# Patient Record
Sex: Male | Born: 1995 | Race: Black or African American | Hispanic: No | Marital: Single | Smoking: Former smoker
Health system: Southern US, Community
[De-identification: ages and names within clinical notes are randomized; demographics above are authoritative.]

## PROBLEM LIST (undated history)

## (undated) ENCOUNTER — Ambulatory Visit

---
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
## 2017-01-27 ENCOUNTER — Encounter (HOSPITAL_COMMUNITY): Payer: Self-pay | Admitting: Emergency Medicine

## 2017-01-27 ENCOUNTER — Emergency Department (HOSPITAL_COMMUNITY)
Admission: EM | Admit: 2017-01-27 | Discharge: 2017-01-27 | Disposition: A | Discharge status: Home / Self Care | Payer: PRIVATE HEALTH INSURANCE | Attending: Emergency Medicine | Admitting: Emergency Medicine

## 2017-01-27 DIAGNOSIS — H00012 Hordeolum externum right lower eyelid: Secondary | ICD-10-CM

## 2017-01-27 DIAGNOSIS — F17210 Nicotine dependence, cigarettes, uncomplicated: Secondary | ICD-10-CM | POA: Insufficient documentation

## 2017-01-27 DIAGNOSIS — H5711 Ocular pain, right eye: Secondary | ICD-10-CM | POA: Diagnosis present

## 2017-01-27 MED ORDER — ERYTHROMYCIN 5 MG/GM OP OINT: TOPICAL_OINTMENT | OPHTHALMIC | 0 refills | Status: DC

## 2017-01-27 NOTE — Discharge Instructions (Signed)
Apply antibiotic ointment as directed.  Follow-up with your primary care doctor in 24-48 hours for further evaluation.   Use warm compresses on the eye drop a day to help with continued drainage.  Return to the emergency department for any worsening pain, vision changes, fever, stroke swelling or redness of the eyelid or any other worsening or concerning symptoms.

## 2017-01-27 NOTE — ED Notes (Signed)
Pt presents with R eye swelling and pain with "white" discharge today.  Swelling started yesterday.  Does not recall injuring eye.

## 2017-01-27 NOTE — ED Triage Notes (Signed)
R ee swelling and discharge since yesterday. Pain when palpating

## 2017-01-27 NOTE — ED Provider Notes (Signed)
[facility]-EMERGENCY DEPT Provider Note   CSN: [id number] Arrival date & time: 01/27/17  2024  By signing my name below, I, Deland PrettySherilynn Knight, attest that this documentation has been prepared under the direction and in the presence of Graciella FreerLindsey Layden, PA-C. Electronically Signed: Deland PrettySherilynn Knight, ED Scribe. 01/27/17. 11:08 PM.  History   Chief Complaint Chief Complaint  Patient presents with  . Facial Swelling   The history is provided by the patient. No language interpreter was used.   HPI Comments: Andrew Leon is a 21 y.o. male who presents to the Emergency Department complaining of mild right eye pain and right eyelid swelling that began yesterday morning. Patient reports that today he noticed some drainage to the eye, prompting ED visit. He states that pain is worsened when you touch the lower eyelid. He states that he used a warm compress on his eye for the majority of the day with inadequate relief. He does not currently wear glasses and contacts. NKDA. He denies injury to the eye. He also denies eye redness, fever, visual disturbances, photophobia.  History reviewed. No pertinent past medical history.  There are no active problems to display for this patient.   History reviewed. No pertinent surgical history.     Home Medications    Prior to Admission medications   Medication Sig Start Date End Date Taking? Authorizing Provider  erythromycin ophthalmic ointment Place a 1/2 inch ribbon of ointment into the lower eyelid. 01/27/17   Maxwell CaulLayden, Lindsey A, PA-C  HYDROcodone-acetaminophen (NORCO/VICODIN) 5-325 MG per tablet Take 1 tablet by mouth every 4 (four) hours as needed. 10/16/13   Schorr, Roma KayserKatherine P, NP    Family History No family history on file.  Social History Social History  Substance Use Topics  . Smoking status: Current Every Day Smoker    Packs/day: 1.00    Types: Cigarettes  . Smokeless tobacco: Never Used  . Alcohol use No     Allergies   Patient has no  known allergies.   Review of Systems Review of Systems  Constitutional: Negative for fever.  Eyes: Positive for pain and discharge. Negative for photophobia, redness and visual disturbance.     Physical Exam Updated Vital Signs BP 133/78 (BP Location: Right Arm)   Pulse 90   Temp 97.9 F (36.6 C) (Oral)   Resp 16   Ht 5\' 9"  (1.753 m)   Wt 102.1 kg (225 lb)   SpO2 97%   BMI 33.23 kg/m   Physical Exam  Constitutional: He appears well-developed and well-nourished.  HENT:  Head: Normocephalic and atraumatic.  Eyes: Conjunctivae and EOM are normal. Pupils are equal, round, and reactive to light. Right eye exhibits no discharge. Left eye exhibits discharge and hordeolum. Right conjunctiva is not injected. Left conjunctiva is not injected. No scleral icterus.  No tenderness palpation, edema, erythema of periorbital regions bilaterally. EOMs intact without difficulty. Mild swelling and evidence of a hordeolum to the right lower eyelid with some mild surrounding discharge.  Pulmonary/Chest: Effort normal.  Neurological: He is alert.  Skin: Skin is warm and dry.  Psychiatric: He has a normal mood and affect. His speech is normal and behavior is normal.  Nursing note and vitals reviewed.    ED Treatments / Results   DIAGNOSTIC STUDIES: Oxygen Saturation is 98% on RA, normal by my interpretation.   COORDINATION OF CARE: 11:00 PM-Discussed next steps with pt. Pt verbalized understanding and is agreeable with the plan.   Labs (all labs ordered are listed, but  only abnormal results are displayed) Labs Reviewed - No data to display  EKG  EKG Interpretation None       Radiology No results found.  Procedures Procedures (including critical care time)  Medications Ordered in ED Medications - No data to display   Initial Impression / Assessment and Plan / ED Course  I have reviewed the triage vital signs and the nursing notes.  Pertinent labs & imaging results that  were available during my care of the patient were reviewed by me and considered in my medical decision making (see chart for details).     21 yo M Who presents with right lower eyelid pain 1 day. Patient is afebrile, non-toxic appearing, sitting comfortably on examination table. Presentation appears to be consistent with stye of the right lower eyelid. History/physical exam are not concerning for preseptal cellulitis. Will plan to provide symptomatic treatment for the stye. Conservative therapy discussed with patient. Instructed patient to follow-up with  in 2 days. Return precautions discussed. Patient expresses understanding and agreement to plan.     Final Clinical Impressions(s) / ED Diagnoses   Final diagnoses:  Hordeolum of right lower eyelid, unspecified hordeolum type    New Prescriptions Discharge Medication List as of 01/27/2017 11:22 PM    START taking these medications   Details  erythromycin ophthalmic ointment Place a 1/2 inch ribbon of ointment into the lower eyelid., Print       I personally performed the services described in this documentation, which was scribed in my presence. The recorded information has been reviewed and is accurate.     Maxwell Caul, PA-C 01/28/17 0301    Benjiman Core, MD 01/28/17 1447

## 2017-02-02 ENCOUNTER — Ambulatory Visit: Payer: PRIVATE HEALTH INSURANCE | Admitting: Podiatry

## 2017-02-25 ENCOUNTER — Ambulatory Visit: Payer: PRIVATE HEALTH INSURANCE | Admitting: Podiatry

## 2018-01-16 ENCOUNTER — Emergency Department (HOSPITAL_COMMUNITY)
Admission: EM | Admit: 2018-01-16 | Discharge: 2018-01-16 | Disposition: A | Discharge status: Home / Self Care | Payer: PRIVATE HEALTH INSURANCE | Attending: Emergency Medicine | Admitting: Emergency Medicine

## 2018-01-16 ENCOUNTER — Encounter (HOSPITAL_COMMUNITY): Payer: Self-pay | Admitting: *Deleted

## 2018-01-16 ENCOUNTER — Emergency Department (HOSPITAL_COMMUNITY): Payer: PRIVATE HEALTH INSURANCE

## 2018-01-16 ENCOUNTER — Other Ambulatory Visit: Payer: Self-pay

## 2018-01-16 DIAGNOSIS — S62339A Displaced fracture of neck of unspecified metacarpal bone, initial encounter for closed fracture: Secondary | ICD-10-CM | POA: Diagnosis not present

## 2018-01-16 DIAGNOSIS — F17210 Nicotine dependence, cigarettes, uncomplicated: Secondary | ICD-10-CM | POA: Diagnosis not present

## 2018-01-16 DIAGNOSIS — S6991XA Unspecified injury of right wrist, hand and finger(s), initial encounter: Secondary | ICD-10-CM | POA: Diagnosis present

## 2018-01-16 DIAGNOSIS — Y929 Unspecified place or not applicable: Secondary | ICD-10-CM | POA: Insufficient documentation

## 2018-01-16 DIAGNOSIS — Y998 Other external cause status: Secondary | ICD-10-CM | POA: Insufficient documentation

## 2018-01-16 DIAGNOSIS — Y939 Activity, unspecified: Secondary | ICD-10-CM | POA: Diagnosis not present

## 2018-01-16 MED ORDER — IBUPROFEN 800 MG PO TABS
800.0000 mg | ORAL_TABLET | Freq: Once | ORAL | Status: AC
  Administered 2018-01-16: 800 mg via ORAL
  Filled 2018-01-16: qty 1

## 2018-01-16 MED ORDER — IBUPROFEN 800 MG PO TABS: 800.0000 mg | ORAL_TABLET | Freq: Three times a day (TID) | ORAL | 0 refills | Status: DC
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
## 2018-01-16 NOTE — ED Notes (Signed)
Patient came back in 

## 2018-01-16 NOTE — Discharge Instructions (Signed)
Take the prescribed medication as directed.  Leave splint in place. Follow-up with Dr. Izora Ribasoley-- call his office Monday to schedule appt. Return to the ED for new or worsening symptoms.

## 2018-01-16 NOTE — ED Notes (Signed)
Patient left after triage
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
## 2018-01-21 ENCOUNTER — Ambulatory Visit: Payer: PRIVATE HEALTH INSURANCE | Admitting: Podiatry

## 2018-02-03 ENCOUNTER — Encounter (HOSPITAL_COMMUNITY)
Admission: AD | Disposition: A | Discharge status: Home / Self Care | Payer: Self-pay | Source: Ambulatory Visit | Attending: Orthopedic Surgery

## 2018-02-03 ENCOUNTER — Ambulatory Visit (HOSPITAL_COMMUNITY): Payer: PRIVATE HEALTH INSURANCE | Admitting: Certified Registered Nurse Anesthetist

## 2018-02-03 ENCOUNTER — Ambulatory Visit (HOSPITAL_COMMUNITY)
Admission: AD | Admit: 2018-02-03 | Discharge: 2018-02-03 | Disposition: A | Discharge status: Home / Self Care | Payer: PRIVATE HEALTH INSURANCE | Source: Ambulatory Visit | Attending: Orthopedic Surgery | Admitting: Orthopedic Surgery

## 2018-02-03 ENCOUNTER — Encounter (HOSPITAL_COMMUNITY): Payer: Self-pay | Admitting: *Deleted

## 2018-02-03 ENCOUNTER — Ambulatory Visit: Payer: PRIVATE HEALTH INSURANCE | Admitting: Podiatry

## 2018-02-03 DIAGNOSIS — Z538 Procedure and treatment not carried out for other reasons: Secondary | ICD-10-CM | POA: Insufficient documentation

## 2018-02-03 DIAGNOSIS — Z6833 Body mass index (BMI) 33.0-33.9, adult: Secondary | ICD-10-CM | POA: Insufficient documentation

## 2018-02-03 DIAGNOSIS — F17210 Nicotine dependence, cigarettes, uncomplicated: Secondary | ICD-10-CM | POA: Insufficient documentation

## 2018-02-03 DIAGNOSIS — E669 Obesity, unspecified: Secondary | ICD-10-CM | POA: Insufficient documentation

## 2018-02-03 DIAGNOSIS — S62327P Displaced fracture of shaft of fifth metacarpal bone, left hand, subsequent encounter for fracture with malunion: Secondary | ICD-10-CM

## 2018-02-03 DIAGNOSIS — S62336P Displaced fracture of neck of fifth metacarpal bone, right hand, subsequent encounter for fracture with malunion: Secondary | ICD-10-CM | POA: Insufficient documentation

## 2018-02-03 HISTORY — PX: OPEN REDUCTION INTERNAL FIXATION (ORIF) METACARPAL: SHX6234

## 2018-02-03 SURGERY — OPEN REDUCTION INTERNAL FIXATION (ORIF) METACARPAL
Anesthesia: General | Laterality: Right

## 2018-02-03 MED ORDER — MIDAZOLAM HCL 2 MG/2ML IJ SOLN
INTRAMUSCULAR | Status: AC
  Filled 2018-02-03: qty 2

## 2018-02-03 MED ORDER — CEFAZOLIN SODIUM-DEXTROSE 2-4 GM/100ML-% IV SOLN
2.0000 g | INTRAVENOUS | Status: DC
  Filled 2018-02-03: qty 100

## 2018-02-03 MED ORDER — PROPOFOL 10 MG/ML IV BOLUS
INTRAVENOUS | Status: AC
  Filled 2018-02-03: qty 20

## 2018-02-03 MED ORDER — LACTATED RINGERS IV SOLN
INTRAVENOUS | Status: DC
  Administered 2018-02-03: 14:00:00 via INTRAVENOUS

## 2018-02-03 MED ORDER — CHLORHEXIDINE GLUCONATE 4 % EX LIQD: 60.0000 mL | Freq: Once | CUTANEOUS | Status: DC

## 2018-02-03 MED ORDER — FENTANYL CITRATE (PF) 250 MCG/5ML IJ SOLN
INTRAMUSCULAR | Status: AC
  Filled 2018-02-03: qty 5

## 2018-02-03 SURGICAL SUPPLY — 48 items
BANDAGE ACE 3X5.8 VEL STRL LF (GAUZE/BANDAGES/DRESSINGS) ×3 IMPLANT
BANDAGE ACE 4X5 VEL STRL LF (GAUZE/BANDAGES/DRESSINGS) ×3 IMPLANT
BLADE CLIPPER SURG (BLADE) IMPLANT
BNDG CMPR 9X4 STRL LF SNTH (GAUZE/BANDAGES/DRESSINGS) ×1
BNDG ESMARK 4X9 LF (GAUZE/BANDAGES/DRESSINGS) ×3 IMPLANT
BNDG GAUZE ELAST 4 BULKY (GAUZE/BANDAGES/DRESSINGS) ×3 IMPLANT
CLOSURE WOUND 1/2 X4 (GAUZE/BANDAGES/DRESSINGS)
CORDS BIPOLAR (ELECTRODE) ×3 IMPLANT
COVER SURGICAL LIGHT HANDLE (MISCELLANEOUS) ×3 IMPLANT
CUFF TOURNIQUET SINGLE 18IN (TOURNIQUET CUFF) ×3 IMPLANT
CUFF TOURNIQUET SINGLE 24IN (TOURNIQUET CUFF) IMPLANT
DRAIN TLS ROUND 10FR (DRAIN) IMPLANT
DRAPE OEC MINIVIEW 54X84 (DRAPES) ×3 IMPLANT
DRAPE SURG 17X11 SM STRL (DRAPES) ×3 IMPLANT
DRSG ADAPTIC 3X8 NADH LF (GAUZE/BANDAGES/DRESSINGS) ×3 IMPLANT
GAUZE SPONGE 4X4 12PLY STRL (GAUZE/BANDAGES/DRESSINGS) ×3 IMPLANT
GAUZE SPONGE 4X4 16PLY XRAY LF (GAUZE/BANDAGES/DRESSINGS) IMPLANT
GLOVE BIOGEL PI IND STRL 8.5 (GLOVE) ×1 IMPLANT
GLOVE BIOGEL PI INDICATOR 8.5 (GLOVE) ×2
GLOVE SURG ORTHO 8.0 STRL STRW (GLOVE) ×3 IMPLANT
GOWN STRL REUS W/ TWL LRG LVL3 (GOWN DISPOSABLE) ×3 IMPLANT
GOWN STRL REUS W/ TWL XL LVL3 (GOWN DISPOSABLE) ×1 IMPLANT
GOWN STRL REUS W/TWL LRG LVL3 (GOWN DISPOSABLE) ×9
GOWN STRL REUS W/TWL XL LVL3 (GOWN DISPOSABLE) ×3
KIT BASIN OR (CUSTOM PROCEDURE TRAY) ×3 IMPLANT
KIT TURNOVER KIT B (KITS) ×3 IMPLANT
MANIFOLD NEPTUNE II (INSTRUMENTS) ×3 IMPLANT
NDL HYPO 25X1 1.5 SAFETY (NEEDLE) ×1 IMPLANT
NEEDLE HYPO 25X1 1.5 SAFETY (NEEDLE) ×3 IMPLANT
NS IRRIG 1000ML POUR BTL (IV SOLUTION) ×3 IMPLANT
PACK ORTHO EXTREMITY (CUSTOM PROCEDURE TRAY) ×3 IMPLANT
PAD ARMBOARD 7.5X6 YLW CONV (MISCELLANEOUS) ×6 IMPLANT
PAD CAST 4YDX4 CTTN HI CHSV (CAST SUPPLIES) ×1 IMPLANT
PADDING CAST COTTON 4X4 STRL (CAST SUPPLIES) ×3
SOAP 2 % CHG 4 OZ (WOUND CARE) ×3 IMPLANT
STRIP CLOSURE SKIN 1/2X4 (GAUZE/BANDAGES/DRESSINGS) IMPLANT
SUT ETHILON 4 0 PS 2 18 (SUTURE) IMPLANT
SUT MNCRL AB 4-0 PS2 18 (SUTURE) IMPLANT
SUT VIC AB 2-0 FS1 27 (SUTURE) IMPLANT
SUT VICRYL 4-0 PS2 18IN ABS (SUTURE) IMPLANT
SYR CONTROL 10ML LL (SYRINGE) IMPLANT
SYSTEM CHEST DRAIN TLS 7FR (DRAIN) IMPLANT
TOWEL OR 17X24 6PK STRL BLUE (TOWEL DISPOSABLE) ×3 IMPLANT
TOWEL OR 17X26 10 PK STRL BLUE (TOWEL DISPOSABLE) ×3 IMPLANT
TUBE CONNECTING 12'X1/4 (SUCTIONS) ×1
TUBE CONNECTING 12X1/4 (SUCTIONS) ×2 IMPLANT
WATER STERILE IRR 1000ML POUR (IV SOLUTION) ×3 IMPLANT
YANKAUER SUCT BULB TIP NO VENT (SUCTIONS) IMPLANT

## 2018-02-03 NOTE — Progress Notes (Signed)
Dr. Melvyn Novasrtmann stated patient's surgery cancelled due to an emergency case in the OR. Dc'd patient to home with family.
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
## 2018-02-03 NOTE — Discharge Instructions (Signed)
KEEP BANDAGE CLEAN AND DRY CALL OFFICE FOR F/U APPT [phone] IN 12 DAYS RX SENT TO [facility] [address] KEEP HAND ELEVATED ABOVE HEART OK TO APPLY ICE TO OPERATIVE AREA CONTACT OFFICE IF ANY WORSENING PAIN OR CONCERNS.

## 2018-02-03 NOTE — Anesthesia Preprocedure Evaluation (Deleted)
Anesthesia Evaluation  Patient identified by MRN, date of birth, ID band Patient awake    Reviewed: Allergy & Precautions, NPO status , Patient's Chart, lab work & pertinent test results  Airway Mallampati: II  TM Distance: >3 FB Neck ROM: Full    Dental no notable dental hx.    Pulmonary neg pulmonary ROS, Current Smoker,    Pulmonary exam normal breath sounds clear to auscultation       Cardiovascular negative cardio ROS Normal cardiovascular exam Rhythm:Regular Rate:Normal     Neuro/Psych negative neurological ROS  negative psych ROS   GI/Hepatic negative GI ROS, Neg liver ROS,   Endo/Other  negative endocrine ROS  Renal/GU negative Renal ROS     Musculoskeletal negative musculoskeletal ROS (+)   Abdominal (+) + obese,   Peds  Hematology negative hematology ROS (+)   Anesthesia Other Findings   Reproductive/Obstetrics                             Anesthesia Physical Anesthesia Plan  ASA: II  Anesthesia Plan: General   Post-op Pain Management:    Induction: Intravenous  PONV Risk Score and Plan: 2 and Ondansetron, Dexamethasone and Midazolam  Airway Management Planned: LMA  Additional Equipment:   Intra-op Plan:   Post-operative Plan: Extubation in OR  Informed Consent: I have reviewed the patients History and Physical, chart, labs and discussed the procedure including the risks, benefits and alternatives for the proposed anesthesia with the patient or authorized representative who has indicated his/her understanding and acceptance.   Dental advisory given  Plan Discussed with: CRNA  Anesthesia Plan Comments:         Anesthesia Quick Evaluation

## 2018-02-05 ENCOUNTER — Encounter (HOSPITAL_COMMUNITY): Payer: Self-pay | Admitting: Orthopedic Surgery
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
## 2018-02-09 ENCOUNTER — Encounter (HOSPITAL_COMMUNITY): Payer: Self-pay | Admitting: *Deleted

## 2018-02-09 ENCOUNTER — Other Ambulatory Visit: Payer: Self-pay

## 2018-02-09 NOTE — Progress Notes (Signed)
Spoke with pt for pre-op call. Pt was here last week and his surgery was cancelled due to an emergency. Pt verified that nothing has changed with his allergies, medications, medical and surgical history.

## 2018-02-10 ENCOUNTER — Ambulatory Visit (HOSPITAL_COMMUNITY)
Admission: RE | Admit: 2018-02-10 | Discharge: 2018-02-10 | Disposition: A | Discharge status: Home / Self Care | Payer: PRIVATE HEALTH INSURANCE | Source: Ambulatory Visit | Attending: Orthopedic Surgery | Admitting: Orthopedic Surgery

## 2018-02-10 ENCOUNTER — Encounter (HOSPITAL_COMMUNITY)
Admission: RE | Disposition: A | Discharge status: Home / Self Care | Payer: Self-pay | Source: Ambulatory Visit | Attending: Orthopedic Surgery

## 2018-02-10 ENCOUNTER — Encounter (HOSPITAL_COMMUNITY): Payer: Self-pay | Admitting: *Deleted

## 2018-02-10 ENCOUNTER — Ambulatory Visit (HOSPITAL_COMMUNITY): Payer: PRIVATE HEALTH INSURANCE | Admitting: Anesthesiology

## 2018-02-10 DIAGNOSIS — S62306P Unspecified fracture of fifth metacarpal bone, right hand, subsequent encounter for fracture with malunion: Secondary | ICD-10-CM

## 2018-02-10 DIAGNOSIS — F17210 Nicotine dependence, cigarettes, uncomplicated: Secondary | ICD-10-CM | POA: Insufficient documentation

## 2018-02-10 DIAGNOSIS — S62336A Displaced fracture of neck of fifth metacarpal bone, right hand, initial encounter for closed fracture: Secondary | ICD-10-CM | POA: Insufficient documentation

## 2018-02-10 HISTORY — PX: OPEN REDUCTION INTERNAL FIXATION (ORIF) METACARPAL: SHX6234

## 2018-02-10 LAB — HEMOGLOBIN: HEMOGLOBIN: 16.1 g/dL (ref 13.0–17.0)

## 2018-02-10 SURGERY — OPEN REDUCTION INTERNAL FIXATION (ORIF) METACARPAL
Anesthesia: General | Laterality: Right

## 2018-02-10 MED ORDER — FENTANYL CITRATE (PF) 100 MCG/2ML IJ SOLN
INTRAMUSCULAR | Status: AC
  Administered 2018-02-10: 25 ug via INTRAVENOUS
  Filled 2018-02-10: qty 2

## 2018-02-10 MED ORDER — PROPOFOL 10 MG/ML IV BOLUS
INTRAVENOUS | Status: AC
  Filled 2018-02-10: qty 20

## 2018-02-10 MED ORDER — DEXAMETHASONE SODIUM PHOSPHATE 10 MG/ML IJ SOLN
INTRAMUSCULAR | Status: DC | PRN
  Administered 2018-02-10: 10 mg via INTRAVENOUS

## 2018-02-10 MED ORDER — SODIUM CHLORIDE 0.9 % IV SOLN
INTRAVENOUS | Status: AC
  Filled 2018-02-10: qty 500000

## 2018-02-10 MED ORDER — CEFAZOLIN SODIUM-DEXTROSE 2-4 GM/100ML-% IV SOLN
2.0000 g | INTRAVENOUS | Status: AC
  Administered 2018-02-10: 2 g via INTRAVENOUS
  Filled 2018-02-10: qty 100

## 2018-02-10 MED ORDER — MIDAZOLAM HCL 5 MG/5ML IJ SOLN
INTRAMUSCULAR | Status: DC | PRN
  Administered 2018-02-10: 2 mg via INTRAVENOUS

## 2018-02-10 MED ORDER — FENTANYL CITRATE (PF) 100 MCG/2ML IJ SOLN
25.0000 ug | INTRAMUSCULAR | Status: DC | PRN
  Administered 2018-02-10: 25 ug via INTRAVENOUS
  Administered 2018-02-10: 50 ug via INTRAVENOUS

## 2018-02-10 MED ORDER — LACTATED RINGERS IV SOLN
INTRAVENOUS | Status: DC
  Administered 2018-02-10: 15:00:00 via INTRAVENOUS

## 2018-02-10 MED ORDER — CHLORHEXIDINE GLUCONATE 4 % EX LIQD: 60.0000 mL | Freq: Once | CUTANEOUS | Status: DC

## 2018-02-10 MED ORDER — OXYCODONE HCL 5 MG PO TABS
5.0000 mg | ORAL_TABLET | Freq: Once | ORAL | Status: AC | PRN
  Administered 2018-02-10: 5 mg via ORAL

## 2018-02-10 MED ORDER — ONDANSETRON HCL 4 MG/2ML IJ SOLN: 4.0000 mg | Freq: Four times a day (QID) | INTRAMUSCULAR | Status: DC | PRN

## 2018-02-10 MED ORDER — FENTANYL CITRATE (PF) 100 MCG/2ML IJ SOLN
INTRAMUSCULAR | Status: AC
  Filled 2018-02-10: qty 2

## 2018-02-10 MED ORDER — SODIUM CHLORIDE 0.9 % IV SOLN
INTRAVENOUS | Status: DC | PRN
  Administered 2018-02-10: 500 mL

## 2018-02-10 MED ORDER — ONDANSETRON HCL 4 MG/2ML IJ SOLN
INTRAMUSCULAR | Status: AC
  Filled 2018-02-10: qty 2

## 2018-02-10 MED ORDER — MIDAZOLAM HCL 2 MG/2ML IJ SOLN
INTRAMUSCULAR | Status: AC
  Filled 2018-02-10: qty 2

## 2018-02-10 MED ORDER — LIDOCAINE 2% (20 MG/ML) 5 ML SYRINGE
INTRAMUSCULAR | Status: AC
  Filled 2018-02-10: qty 5

## 2018-02-10 MED ORDER — OXYCODONE HCL 5 MG/5ML PO SOLN
5.0000 mg | Freq: Once | ORAL | Status: AC | PRN
  Filled 2018-02-10: qty 5

## 2018-02-10 MED ORDER — BUPIVACAINE HCL 0.25 % IJ SOLN
INTRAMUSCULAR | Status: DC | PRN
  Administered 2018-02-10: 8 mL

## 2018-02-10 MED ORDER — PROPOFOL 10 MG/ML IV BOLUS
INTRAVENOUS | Status: DC | PRN
  Administered 2018-02-10: 290 mg via INTRAVENOUS

## 2018-02-10 MED ORDER — FENTANYL CITRATE (PF) 100 MCG/2ML IJ SOLN
INTRAMUSCULAR | Status: DC | PRN
  Administered 2018-02-10: 25 ug via INTRAVENOUS
  Administered 2018-02-10: 50 ug via INTRAVENOUS
  Administered 2018-02-10: 25 ug via INTRAVENOUS
  Administered 2018-02-10 (×4): 50 ug via INTRAVENOUS

## 2018-02-10 MED ORDER — ONDANSETRON HCL 4 MG/2ML IJ SOLN
INTRAMUSCULAR | Status: DC | PRN
  Administered 2018-02-10: 4 mg via INTRAVENOUS

## 2018-02-10 MED ORDER — LIDOCAINE 2% (20 MG/ML) 5 ML SYRINGE
INTRAMUSCULAR | Status: DC | PRN
  Administered 2018-02-10: 100 mg via INTRAVENOUS

## 2018-02-10 MED ORDER — OXYCODONE HCL 5 MG PO TABS
ORAL_TABLET | ORAL | Status: AC
  Filled 2018-02-10: qty 1

## 2018-02-10 MED ORDER — DEXAMETHASONE SODIUM PHOSPHATE 10 MG/ML IJ SOLN
INTRAMUSCULAR | Status: AC
  Filled 2018-02-10: qty 1

## 2018-02-10 MED ORDER — BUPIVACAINE HCL (PF) 0.25 % IJ SOLN
INTRAMUSCULAR | Status: AC
  Filled 2018-02-10: qty 30

## 2018-02-10 SURGICAL SUPPLY — 37 items
BAG SPEC THK2 15X12 ZIP CLS (MISCELLANEOUS)
BAG ZIPLOCK 12X15 (MISCELLANEOUS) ×1 IMPLANT
BIT DRILL 1.1 (BIT) ×2
BIT DRILL 1.1MM (BIT) ×1
BIT DRILL 60X20X1.1XQC TMX (BIT) IMPLANT
BIT DRL 60X20X1.1XQC TMX (BIT) ×1
BLADE SURG SZ10 CARB STEEL (BLADE) ×6 IMPLANT
COVER SURGICAL LIGHT HANDLE (MISCELLANEOUS) ×3 IMPLANT
CUFF TOURN SGL QUICK 18 (TOURNIQUET CUFF) ×3 IMPLANT
DRAPE OEC MINIVIEW 54X84 (DRAPES) ×2 IMPLANT
DRAPE SURG 17X11 SM STRL (DRAPES) ×3 IMPLANT
ELECT REM PT RETURN 15FT ADLT (MISCELLANEOUS) ×3 IMPLANT
GAUZE SPONGE 4X4 12PLY STRL (GAUZE/BANDAGES/DRESSINGS) ×6 IMPLANT
GLOVE BIO SURGEON STRL SZ8 (GLOVE) ×17 IMPLANT
GOWN STRL REUS W/TWL XL LVL3 (GOWN DISPOSABLE) ×7 IMPLANT
K-WIRE DBL TRONS .035X6 (WIRE) ×3
KIT BASIN OR (CUSTOM PROCEDURE TRAY) ×3 IMPLANT
KWIRE DBL TRONS .035X6 (WIRE) IMPLANT
MANIFOLD NEPTUNE II (INSTRUMENTS) ×3 IMPLANT
NS IRRIG 1000ML POUR BTL (IV SOLUTION) ×3 IMPLANT
PACK ORTHO EXTREMITY (CUSTOM PROCEDURE TRAY) ×3 IMPLANT
PAD CAST 4YDX4 CTTN HI CHSV (CAST SUPPLIES) ×1 IMPLANT
PADDING CAST COTTON 4X4 STRL (CAST SUPPLIES) ×3
PLATE T CONT 1.5MM LOCKING (Plate) ×2 IMPLANT
POSITIONER SURGICAL ARM (MISCELLANEOUS) ×1 IMPLANT
SCREW L 1.5X14 (Screw) ×6 IMPLANT
SCREW NL 1.5X12 (Screw) ×8 IMPLANT
SCREW NONIOC 1.5 14M (Screw) ×4 IMPLANT
SOL PREP POV-IOD 4OZ 10% (MISCELLANEOUS) ×3 IMPLANT
SOL PREP PROV IODINE SCRUB 4OZ (MISCELLANEOUS) ×3 IMPLANT
SUT MNCRL AB 4-0 PS2 18 (SUTURE) ×2 IMPLANT
SUT PROLENE 3 0 PS 2 (SUTURE) ×3 IMPLANT
SUT PROLENE 4 0 PS 2 18 (SUTURE) ×2 IMPLANT
SUT VIC AB 4-0 SH 27 (SUTURE) ×3
SUT VIC AB 4-0 SH 27XANBCTRL (SUTURE) IMPLANT
TOWEL OR 17X26 10 PK STRL BLUE (TOWEL DISPOSABLE) ×3 IMPLANT
WATER STERILE IRR 1000ML POUR (IV SOLUTION) ×3 IMPLANT

## 2018-02-10 NOTE — Anesthesia Procedure Notes (Signed)
Procedure Name: LMA Insertion Date/Time: 02/10/2018 4:41 PM Performed by: Elisabeth CaraArmistead, Kalina Morabito A, CRNA Pre-anesthesia Checklist: Patient identified, Emergency Drugs available, Suction available, Patient being monitored and Timeout performed Patient Re-evaluated:Patient Re-evaluated prior to induction Oxygen Delivery Method: Circle system utilized Preoxygenation: Pre-oxygenation with 100% oxygen Induction Type: IV induction LMA: LMA with gastric port inserted LMA Size: 4.0 Number of attempts: 1 Placement Confirmation: positive ETCO2 and breath sounds checked- equal and bilateral Tube secured with: Tape Dental Injury: Teeth and Oropharynx as per pre-operative assessment

## 2018-02-10 NOTE — Anesthesia Preprocedure Evaluation (Signed)
Anesthesia Evaluation  Patient identified by MRN, date of birth, ID band Patient awake    Reviewed: Allergy & Precautions, H&P , NPO status , Patient's Chart, lab work & pertinent test results  Airway Mallampati: II   Neck ROM: full    Dental   Pulmonary Current Smoker,    breath sounds clear to auscultation       Cardiovascular negative cardio ROS   Rhythm:regular Rate:Normal     Neuro/Psych    GI/Hepatic   Endo/Other  obese  Renal/GU      Musculoskeletal   Abdominal   Peds  Hematology   Anesthesia Other Findings   Reproductive/Obstetrics                             Anesthesia Physical Anesthesia Plan  ASA: II  Anesthesia Plan: General   Post-op Pain Management:    Induction: Intravenous  PONV Risk Score and Plan: 1 and Ondansetron, Dexamethasone, Midazolam and Treatment may vary due to age or medical condition  Airway Management Planned: LMA  Additional Equipment:   Intra-op Plan:   Post-operative Plan:   Informed Consent: I have reviewed the patients History and Physical, chart, labs and discussed the procedure including the risks, benefits and alternatives for the proposed anesthesia with the patient or authorized representative who has indicated his/her understanding and acceptance.     Plan Discussed with: CRNA, Anesthesiologist and Surgeon  Anesthesia Plan Comments:         Anesthesia Quick Evaluation

## 2018-02-10 NOTE — Op Note (Signed)
PREOPERATIVE DIAGNOSIS:right small finger metacarpal neck malunion  POSTOPERATIVE DIAGNOSIS:same  ATTENDING SURGEON:Dr. Gilman SchmidtFred Ortman who was scrubbed and present for the entire procedure  ASSISTANT SURGEON:Samantha Willaim RayasBarton PA-C was scrubbed and necessary for takedown of the malunion open reduction internal fixation closure and splinting in a timely fashion fashion  ANESTHESIA:Gen. Via laryngeal mask airway  OPERATIVE PROCEDURE:#1: Repair right small finger metacarpal malunion with internal fixation #2: Radiographs 3 views right hand  IMPLANTS:Biomet 1.5 mm T plate with a combination of locking and nonlocking screws  RADIOGRAPHIC INTERPRETATION:AP lateral oblique views of the hand do show the internal fixation place in good position with good restoration of the alignment  SURGICAL INDICATIONS:patient is a right-hand-dominant gentleman who sustained a closed injury to his right hand greater than 4 weeks ago. Patient presented to the office with a healed metacarpal neck malunion. Patient is recommended undergo the above procedure wrist benefits and alternatives were discussed in detail the patient and signed informed consent was obtained. Risks include but not limited to bleeding infection damage to nearby nerves arteries or tendons nonunion malunion hardware failure and need for further surgical intervention  SURGICAL TECHNIQUE:patient is properly identified in the preoperative holding area marked for permanent marker made on the right hand indicate correct operative site. Patient then brought back to operating room placed supine on anesthesia and table where general anesthesia was administered. Preoperative antibiotics were given. A well-padded tourniquet was then placed on the right brachium and sealed with a 1000 drape. The right upper extremities and prepped and draped in normal sterile fashion. Timeout was called cracks I was identified and the procedure then begun. Attention was then turned to  the right hand. Longitudinal incision made directly over the small finger metacarpal shaft. Dissection carried down through the skin and subcutaneous tissue. Extensor interval was split between the [facility] and EDQ to the small finger. This exposed the fascia and capsular layer which was incised longitudinally to expose the malunion. Careful takedown of the malunion was then carried out circumferentially around the bone in order to perform the osteochalasis. After osteoclasis the metacarpal is able to be reduced After reduction this restored the overall alignment and took away the angulation deformity at the malunion site. Following this the T plate was then applied to the dorsal surface of the bone and held proximally distally with K wires. Its position was then confirmed using mini C-arm. Following the screw fixation was then carried out with a combination of locking and nonlocking screws. K wires were then removed. Final radiographs were then obtained. The wound was then thoroughly irrigated. Following this the fascial layer was then closed with Vicryl subcutaneous tissues closed with Monocryl and the skin closed a running 4-0 Prolene. 10 mL accord percent Marcaine infiltrated locally. Adaptic dressing a sterile compressive bandage then applied. The patient and placed in well-padded ulnar gutter splint extubated taken recovery room in good condition.  POSTOPERATIVE PLAN:patient be discharged to home. Seen back in the office in 10 days for wound check suture removal x-rays down to see her therapist for an ulnar gutter splint and begin a postoperative ORIF of the metacarpal shaft protocol begin some gentle active range of motion the first visit. I'll see him back around the 3-4 week mark to repeat his radiographs and assess his motion.

## 2018-02-10 NOTE — Discharge Instructions (Signed)
KEEP BANDAGE CLEAN AND DRY CALL OFFICE FOR F/U APPT [phone] IN 13 DAYS KEEP HAND ELEVATED ABOVE HEART OK TO APPLY ICE TO OPERATIVE AREA CONTACT OFFICE IF ANY WORSENING PAIN OR CONCERNS.  

## 2018-02-10 NOTE — Transfer of Care (Signed)
Immediate Anesthesia Transfer of Care Note  Patient: Andrew Leon  Procedure(s) Performed: RIGHT SMALL FINGER REPAIR OF METACARPAL NECK MALUNION (Right )  Patient Location: PACU  Anesthesia Type:General  Level of Consciousness: awake, alert , oriented and patient cooperative  Airway & Oxygen Therapy: Patient Spontanous Breathing and Patient connected to face mask oxygen  Post-op Assessment: Report given to RN, Post -op Vital signs reviewed and stable and Patient moving all extremities  Post vital signs: Reviewed and stable  Last Vitals:  Vitals Value Taken Time  BP 151/100 02/10/2018  6:24 PM  Temp    Pulse 86 02/10/2018  6:28 PM  Resp 20 02/10/2018  6:28 PM  SpO2 91 % 02/10/2018  6:28 PM  Vitals shown include unvalidated device data.  Last Pain:  Vitals:   02/10/18 1442  TempSrc: Oral         Complications: No apparent anesthesia complications

## 2018-02-10 NOTE — Anesthesia Postprocedure Evaluation (Signed)
Anesthesia Post Note  Patient: Andrew Leon  Procedure(s) Performed: RIGHT SMALL FINGER REPAIR OF METACARPAL NECK MALUNION (Right )     Patient location during evaluation: PACU Anesthesia Type: General Level of consciousness: awake and alert Pain management: pain level controlled Vital Signs Assessment: post-procedure vital signs reviewed and stable Respiratory status: spontaneous breathing, nonlabored ventilation, respiratory function stable and patient connected to nasal cannula oxygen Cardiovascular status: blood pressure returned to baseline and stable Postop Assessment: no apparent nausea or vomiting Anesthetic complications: no    Last Vitals:  Vitals:   02/10/18 1845 02/10/18 1900  BP: (!) 149/99 136/90  Pulse: 68 61  Resp: 14 13  Temp:  36.9 C  SpO2: 98% 99%    Last Pain:  Vitals:   02/10/18 1900  TempSrc:   PainSc: 9                  [name] S

## 2018-02-11 ENCOUNTER — Encounter (HOSPITAL_COMMUNITY): Payer: Self-pay | Admitting: Orthopedic Surgery

## 2018-12-02 ENCOUNTER — Ambulatory Visit: Payer: PRIVATE HEALTH INSURANCE | Admitting: Podiatry

## 2018-12-24 ENCOUNTER — Other Ambulatory Visit: Payer: Self-pay

## 2018-12-24 ENCOUNTER — Other Ambulatory Visit: Payer: Self-pay | Admitting: Family Medicine

## 2018-12-24 ENCOUNTER — Ambulatory Visit
Admission: RE | Admit: 2018-12-24 | Discharge: 2018-12-24 | Disposition: A | Discharge status: Home / Self Care | Payer: PRIVATE HEALTH INSURANCE | Source: Ambulatory Visit | Attending: Family Medicine | Admitting: Family Medicine

## 2018-12-24 DIAGNOSIS — S39012A Strain of muscle, fascia and tendon of lower back, initial encounter: Secondary | ICD-10-CM

## 2020-07-26 ENCOUNTER — Telehealth: Payer: Self-pay | Admitting: Unknown Physician Specialty

## 2020-07-26 NOTE — Telephone Encounter (Signed)
Called to Discuss with patient about Covid symptoms and the use of the monoclonal antibody infusion for those with mild to moderate Covid symptoms and at a high risk of hospitalization.     Pt appears to qualify for this infusion due to co-morbid conditions and/or a member of an at-risk group in accordance with the FDA Emergency Use Authorization.    Unable to reach pt    

## 2021-03-19 ENCOUNTER — Other Ambulatory Visit: Payer: Self-pay

## 2021-03-19 ENCOUNTER — Emergency Department (HOSPITAL_COMMUNITY)
Admission: EM | Admit: 2021-03-19 | Discharge: 2021-03-19 | Disposition: A | Discharge status: Home / Self Care | Payer: No Typology Code available for payment source | Attending: Emergency Medicine | Admitting: Emergency Medicine

## 2021-03-19 ENCOUNTER — Emergency Department (HOSPITAL_COMMUNITY): Payer: No Typology Code available for payment source

## 2021-03-19 ENCOUNTER — Encounter (HOSPITAL_COMMUNITY): Payer: Self-pay | Admitting: Emergency Medicine

## 2021-03-19 DIAGNOSIS — F419 Anxiety disorder, unspecified: Secondary | ICD-10-CM | POA: Diagnosis not present

## 2021-03-19 DIAGNOSIS — R079 Chest pain, unspecified: Secondary | ICD-10-CM | POA: Diagnosis present

## 2021-03-19 DIAGNOSIS — R000 Tachycardia, unspecified: Secondary | ICD-10-CM | POA: Insufficient documentation

## 2021-03-19 DIAGNOSIS — R519 Headache, unspecified: Secondary | ICD-10-CM | POA: Diagnosis not present

## 2021-03-19 DIAGNOSIS — R531 Weakness: Secondary | ICD-10-CM | POA: Diagnosis not present

## 2021-03-19 DIAGNOSIS — R062 Wheezing: Secondary | ICD-10-CM | POA: Insufficient documentation

## 2021-03-19 DIAGNOSIS — F17210 Nicotine dependence, cigarettes, uncomplicated: Secondary | ICD-10-CM | POA: Insufficient documentation

## 2021-03-19 DIAGNOSIS — F1190 Opioid use, unspecified, uncomplicated: Secondary | ICD-10-CM | POA: Diagnosis not present

## 2021-03-19 LAB — BASIC METABOLIC PANEL
Anion gap: 10 (ref 5–15)
BUN: 11 mg/dL (ref 6–20)
CO2: 26 mmol/L (ref 22–32)
Calcium: 9.7 mg/dL (ref 8.9–10.3)
Chloride: 103 mmol/L (ref 98–111)
Creatinine, Ser: 0.99 mg/dL (ref 0.61–1.24)
GFR, Estimated: >60 mL/min (ref >60)
Glucose, Bld: 134 mg/dL — ABNORMAL HIGH (ref 70–99)
Potassium: 3.4 mmol/L — ABNORMAL LOW (ref 3.5–5.1)
Sodium: 139 mmol/L (ref 135–145)

## 2021-03-19 LAB — CBC
HCT: 45.7 % (ref 39.0–52.0)
Hemoglobin: 15 g/dL (ref 13.0–17.0)
MCH: 27.5 pg (ref 26.0–34.0)
MCHC: 32.8 g/dL (ref 30.0–36.0)
MCV: 83.9 fL (ref 80.0–100.0)
Platelets: 342 10*3/uL (ref 150–400)
RBC: 5.45 MIL/uL (ref 4.22–5.81)
RDW: 13.7 % (ref 11.5–15.5)
WBC: 12.4 10*3/uL — ABNORMAL HIGH (ref 4.0–10.5)
nRBC: 0 % (ref 0.0–0.2)

## 2021-03-19 LAB — URINALYSIS, ROUTINE W REFLEX MICROSCOPIC
Bacteria, UA: NONE SEEN
Bilirubin Urine: NEGATIVE
Glucose, UA: NEGATIVE mg/dL
Ketones, ur: NEGATIVE mg/dL
Leukocytes,Ua: NEGATIVE
Nitrite: NEGATIVE
Protein, ur: NEGATIVE mg/dL
Specific Gravity, Urine: 1.021 (ref 1.005–1.030)
pH: 5 (ref 5.0–8.0)

## 2021-03-19 LAB — RAPID URINE DRUG SCREEN, HOSP PERFORMED
Amphetamines: NOT DETECTED
Barbiturates: NOT DETECTED
Benzodiazepines: NOT DETECTED
Cocaine: NOT DETECTED
Opiates: POSITIVE — AB
Tetrahydrocannabinol: NOT DETECTED

## 2021-03-19 LAB — TROPONIN I (HIGH SENSITIVITY)
Troponin I (High Sensitivity): 3 ng/L (ref <18)
Troponin I (High Sensitivity): 4 ng/L (ref <18)

## 2021-03-19 MED ORDER — SODIUM CHLORIDE 0.9 % IV BOLUS
1000.0000 mL | Freq: Once | INTRAVENOUS | Status: AC
  Administered 2021-03-19: 1000 mL via INTRAVENOUS

## 2021-03-19 MED ORDER — ACETAMINOPHEN 325 MG PO TABS
650.0000 mg | ORAL_TABLET | Freq: Once | ORAL | Status: AC
Start: 2021-03-19 — End: 2021-03-19
  Administered 2021-03-19: 650 mg via ORAL
  Filled 2021-03-19: qty 2

## 2021-03-19 NOTE — ED Notes (Signed)
Pt discharged from this ED in stable condition at this time. All discharge instructions and follow up care reviewed with pt with no further questions at this time. Pt ambulatory with steady gait, clear speech.  

## 2021-03-19 NOTE — ED Provider Notes (Addendum)
Pt seen in conjunction with C Prosperi, PA-C. Please see his notes for full history, exam, and plan  In brief, patient presented for evaluation of chest pain which was going on earlier today, but has since resolved.  Also reporting anxiety and feels generalized weakness, especially in the legs.  On exam, patient appears nontoxic.  When assessing strength in lower extremities, patient has weakness but this seems very effort dependent.  There is no attempted contraction of any muscle.  Good sensation and cap refill.  He has no back pain, no history of IV drug use.  Doubt discitis/abscess.  Doubt cauda equina.  Favor somatic leg cause/psychiatric cause.  Labs with normal reassuring.  UDS positive only for opioids.  No signs of infection.  Discussed with patient.  Patient able to stand after fluids, and states he is feeling little bit better.  Discussed importance of hydration.  Patient to be discharged     Alveria Apley, PA-C 03/19/21 9030    Mesner, Barbara Cower, MD 03/19/21 [phone]

## 2021-03-19 NOTE — ED Provider Notes (Signed)
[facility]-EMERGENCY DEPT Provider Note   CSN: [id number] Arrival date & time: 03/19/21  0145     History Chief Complaint  Patient presents with   Chest Pain    Andrew Leon is a 25 y.o. male with a PMH of asthma, anxiety who presents with a new onset complaint of chest pain, weakness, and feeling "not right". The patient reports he was playing bass when the pain started, and reported he called EMS once the pain continued, and that his legs became weak and started to give out. He denies back pain, but reports he is very weak. The patient reports he was smoking a blunt earlier tonight, and that he smokes 15 blunts per day. Patient asserts these are tobacco-only. Patient also endorses use of percocets, unknown dosage, but that he take 9-15 per day, and has been for >1 year. The patient gets his percocet from the street. Patient reports he has been struggling with his asthma, and was using a red inhaler almost every day, although he hasn't needed it recently. Patient reports the chest pain comes and goes, is not pleuritic, is not worse with exertion. Patient also endorses headache. The patient reports he has eaten one bowl of cereal today, often only eats one meal per day. Patient reports normal fluid intake and denies etoh intake this evening.   Chest Pain Associated symptoms: fatigue, headache and weakness   Associated symptoms: no abdominal pain, no back pain, no cough, no dizziness, no fever, no nausea, no numbness, no shortness of breath and no vomiting       History reviewed. No pertinent past medical history.  There are no problems to display for this patient.   Past Surgical History:  Procedure Laterality Date   OPEN REDUCTION INTERNAL FIXATION (ORIF) METACARPAL Right 02/03/2018   Procedure: RIGHT SMALL FINGER REPAIR OF METACARPAL NECK MALUNION;  Surgeon: Bradly Bienenstock, MD;  Location: [facility] OR;  Service: Orthopedics;  Laterality: Right;   OPEN REDUCTION INTERNAL  FIXATION (ORIF) METACARPAL Right 02/10/2018   Procedure: RIGHT SMALL FINGER REPAIR OF METACARPAL NECK MALUNION;  Surgeon: Bradly Bienenstock, MD;  Location: [facility] ORS;  Service: Orthopedics;  Laterality: Right;       History reviewed. No pertinent family history.  Social History   Tobacco Use   Smoking status: Every Day    Packs/day: 1.00    Years: 8.00    Pack years: 8.00    Types: Cigarettes   Smokeless tobacco: Never  Vaping Use   Vaping Use: Never used  Substance Use Topics   Alcohol use: Yes    Comment: occ x week   Drug use: Yes    Types: Marijuana    Comment: percocet    Home Medications Prior to Admission medications   Not on File    Allergies    Patient has no known allergies.  Review of Systems   Review of Systems  Constitutional:  Positive for fatigue. Negative for chills and fever.  HENT:  Negative for rhinorrhea, sinus pain and sore throat.   Eyes:  Positive for redness.  Respiratory:  Positive for wheezing. Negative for cough, chest tightness and shortness of breath.   Cardiovascular:  Positive for chest pain.  Gastrointestinal:  Negative for abdominal pain, constipation, diarrhea, nausea and vomiting.  Genitourinary:  Negative for difficulty urinating, dysuria, flank pain and urgency.  Musculoskeletal:  Negative for back pain and gait problem.  Neurological:  Positive for weakness and headaches. Negative for dizziness, seizures, syncope and numbness.  Physical Exam Updated Vital Signs BP (!) 151/87   Pulse 91   Temp 99.1 F (37.3 C) (Oral)   Resp 18   Ht 5\' 9"  (1.753 m)   Wt 102.1 kg   SpO2 99%   BMI 33.23 kg/m   Physical Exam Vitals and nursing note reviewed.  Constitutional:      Appearance: Normal appearance. He is well-developed. He is not ill-appearing or toxic-appearing.     Comments: Sleepy but arousable. Eyes closed or half closed at rest.  HENT:     Head: Normocephalic and atraumatic.     Nose: No congestion or rhinorrhea.  Eyes:      General:        Right eye: No discharge.        Left eye: No discharge.     Extraocular Movements: Extraocular movements intact.     Pupils: Pupils are equal, round, and reactive to light.     Comments: Conjunctivae are bloodshot red. Pupils are 62mm.  Cardiovascular:     Rate and Rhythm: Regular rhythm. Tachycardia present.     Heart sounds: No murmur heard.   No friction rub. No gallop.     Comments: Pulses 2+ in radial artery, DP, PT bil Pulmonary:     Effort: Pulmonary effort is normal. No respiratory distress.     Breath sounds: Wheezing present.  Abdominal:     General: Bowel sounds are normal.     Palpations: Abdomen is soft.  Musculoskeletal:        General: No swelling, tenderness, deformity or signs of injury.     Right lower leg: No edema.     Left lower leg: No edema.     Comments: Strength 5/5 in bil UE. Strength 1/5 in LE, but seemingly effort dependent. Patient able to contract muscles, raise legs with prompting, but seems hesitant to do so. Patient is able to stand and walk without difficulty or wobbling, adds to thought that strength deficit in legs is effort dependent.  Skin:    General: Skin is warm and dry.     Capillary Refill: Capillary refill takes less than 2 seconds.  Neurological:     Mental Status: He is oriented to person, place, and time.     Cranial Nerves: No cranial nerve deficit.     Sensory: No sensory deficit.     Motor: Weakness present.  Psychiatric:        Mood and Affect: Mood normal.     Comments: Patient is anxious    ED Results / Procedures / Treatments   Labs (all labs ordered are listed, but only abnormal results are displayed) Labs Reviewed  BASIC METABOLIC PANEL - Abnormal; Notable for the following components:      Result Value   Potassium 3.4 (*)    Glucose, Bld 134 (*)    All other components within normal limits  CBC - Abnormal; Notable for the following components:   WBC 12.4 (*)    All other components within normal  limits  URINALYSIS, ROUTINE W REFLEX MICROSCOPIC - Abnormal; Notable for the following components:   Hgb urine dipstick SMALL (*)    All other components within normal limits  RAPID URINE DRUG SCREEN, HOSP PERFORMED - Abnormal; Notable for the following components:   Opiates POSITIVE (*)    All other components within normal limits  TROPONIN I (HIGH SENSITIVITY)  TROPONIN I (HIGH SENSITIVITY)    EKG None  Radiology DG Chest 2 View  Result Date:  03/19/2021 CLINICAL DATA:  Chest pain EXAM: CHEST - 2 VIEW COMPARISON:  None. FINDINGS: Lungs are clear.  No pleural effusion or pneumothorax. The heart is normal in size. Visualized osseous structures are within normal limits. IMPRESSION: Normal chest radiographs. Electronically Signed   By: Charline Bills M.D.   On: 03/19/2021 02:25    Procedures Procedures   Medications Ordered in ED Medications  acetaminophen (TYLENOL) tablet 650 mg (650 mg Oral Given 03/19/21 0331)  sodium chloride 0.9 % bolus 1,000 mL (1,000 mLs Intravenous New Bag/Given 03/19/21 0344)    ED Course  I have reviewed the triage vital signs and the nursing notes.  Pertinent labs & imaging results that were available during my care of the patient were reviewed by me and considered in my medical decision making (see chart for details).  Clinical Course as of 03/19/21 0512  Tue Mar 19, 2021  0457 WBC(!): 12.4 Favor stress response [CP]    Clinical Course User Index [CP] Olene Floss, PA-C   MDM Rules/Calculators/A&P                         Initial cardiac workup with negative troponin, EKG normal, CXR normal. Patient with HEART score of 1. No evidence of respiratory distress. No evidence of cardiovascular compromise. ACS, Dissection, Pericarditis, PE, esophageal tear considered and ruled out.  Expiratory wheezes consistent with self-described history of asthma, but no evidence of acute exacerbation at this time.   Discussed it is difficult to assess  whether his percocets could be contributing to his clinical condition since they were not obtained as a prescription. UDS and UA obtained to assess recent drugs of abuse, and look for signs of dehydration. Fluid resuscitation initiated for complaints of weakness, low intake over last few days. Guillain Barre considered but patient able to stand and walk without difficulty. Weakness appears psychologic not pathologic based on exam and lab findings.  UA unremarkable. UDS positive for opioids expected. Patient reports some clinical improvement after fluid resuscitation.  Return precautions discussed. Patient encouraged to discontinue opioids and tobacco use. Final Clinical Impression(s) / ED Diagnoses Final diagnoses:  Chest pain, unspecified type  Anxiety  Opioid use    Rx / DC Orders ED Discharge Orders     None        [place] 03/19/21 4098    Marily Memos, MD 03/19/21 [phone]

## 2021-03-19 NOTE — Discharge Instructions (Addendum)
As we discussed there is no signs of emergent cardiovascular or lung disease, or other signs of infection. Your bloodwork and urine looked reassuring. I recommend cutting back on tobacco use as well as your percocet use.

## 2021-03-19 NOTE — ED Triage Notes (Signed)
Patient comes from home by [facility]. Patient took a Microbiologist on a regular bases. Patient took a percocet about an hour ago and is complaining of chest pain and feeling crazy.

## 2021-05-04 ENCOUNTER — Emergency Department (HOSPITAL_COMMUNITY)
Admission: EM | Admit: 2021-05-04 | Discharge: 2021-05-04 | Disposition: A | Discharge status: Home / Self Care | Payer: No Typology Code available for payment source | Attending: Emergency Medicine | Admitting: Emergency Medicine

## 2021-05-04 ENCOUNTER — Other Ambulatory Visit: Payer: Self-pay

## 2021-05-04 ENCOUNTER — Emergency Department (HOSPITAL_COMMUNITY): Payer: No Typology Code available for payment source

## 2021-05-04 DIAGNOSIS — R1031 Right lower quadrant pain: Secondary | ICD-10-CM | POA: Diagnosis not present

## 2021-05-04 DIAGNOSIS — N432 Other hydrocele: Secondary | ICD-10-CM | POA: Diagnosis not present

## 2021-05-04 DIAGNOSIS — F17210 Nicotine dependence, cigarettes, uncomplicated: Secondary | ICD-10-CM | POA: Insufficient documentation

## 2021-05-04 DIAGNOSIS — N50811 Right testicular pain: Secondary | ICD-10-CM | POA: Diagnosis not present

## 2021-05-04 DIAGNOSIS — N50819 Testicular pain, unspecified: Secondary | ICD-10-CM

## 2021-05-04 LAB — URINALYSIS, ROUTINE W REFLEX MICROSCOPIC
Bacteria, UA: NONE SEEN
Bilirubin Urine: NEGATIVE
Glucose, UA: NEGATIVE mg/dL
Ketones, ur: NEGATIVE mg/dL
Leukocytes,Ua: NEGATIVE
Nitrite: NEGATIVE
Protein, ur: NEGATIVE mg/dL
Specific Gravity, Urine: 1.016 (ref 1.005–1.030)
pH: 6 (ref 5.0–8.0)

## 2021-05-04 MED ORDER — IBUPROFEN 600 MG PO TABS: 600.0000 mg | ORAL_TABLET | Freq: Four times a day (QID) | ORAL | 0 refills | Status: AC | PRN

## 2021-05-04 MED ORDER — KETOROLAC TROMETHAMINE 30 MG/ML IJ SOLN
30.0000 mg | Freq: Once | INTRAMUSCULAR | Status: AC
  Administered 2021-05-04: 30 mg via INTRAMUSCULAR
  Filled 2021-05-04: qty 1

## 2021-05-04 NOTE — ED Notes (Signed)
Finished ultrasound

## 2021-05-04 NOTE — ED Provider Notes (Signed)
[facility]-EMERGENCY DEPT Provider Note   CSN: [id number] Arrival date & time: 05/04/21  0212     History Chief Complaint  Patient presents with   Testicle Pain    Andrew Leon is a 25 y.o. male.  HPI     This is a 25 year old male who presents with right testicle pain.  Patient reports 24-hour history right lower quadrant and right testicle pain.  He states that it is worse when he stands up.  He took a Microbiologist with some relief.  He rates his pain 8 out of 10.  He has not noted any skin changes or swelling.  Denies hematuria, dysuria, penile discharge.  Denies fevers.  Denies concerns for STDs including new sexual partners.  He has never had pain like this before.  Denies back pain or history of kidney stones.  No past medical history on file.  There are no problems to display for this patient.   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  Social History   Tobacco Use   Smoking status: Every Day    Packs/day: 1.00    Years: 8.00    Pack years: 8.00    Types: Cigarettes   Smokeless tobacco: Never  Vaping Use   Vaping Use: Never used  Substance Use Topics   Alcohol use: Yes    Comment: occ x week   Drug use: Yes    Types: Marijuana    Comment: percocet    Home Medications Prior to Admission medications   Medication Sig Start Date End Date Taking? Authorizing Provider  ibuprofen (ADVIL) 600 MG tablet Take 1 tablet (600 mg total) by mouth every 6 (six)  hours as needed. 05/04/21  Yes Kalon Erhardt, Mayer Masker, MD    Allergies    Patient has no known allergies.  Review of Systems   Review of Systems  Constitutional:  Negative for fever.  Respiratory:  Negative for shortness of breath.   Cardiovascular:  Negative for chest pain.  Gastrointestinal:  Positive for abdominal pain. Negative for nausea and vomiting.  Genitourinary:  Positive for testicular pain. Negative for dysuria, hematuria, penile pain, penile swelling and scrotal swelling.  All other systems reviewed and are negative.  Physical Exam Updated Vital Signs BP 122/74   Pulse 92   Temp 98.3 F (36.8 C) (Oral)   Resp 18   Ht 1.753 m (5\' 9" )   Wt 99.8 kg   SpO2 98%   BMI 32.49 kg/m   Physical Exam Vitals and nursing note reviewed. Exam conducted with a chaperone present.  Constitutional:      Appearance: He is well-developed. He is not ill-appearing.  HENT:     Head: Normocephalic and atraumatic.     Mouth/Throat:     Mouth: Mucous membranes are moist.  Eyes:     Pupils: Pupils are equal, round, and reactive to light.  Cardiovascular:     Rate and Rhythm: Normal rate and regular rhythm.  Pulmonary:     Effort: Pulmonary  effort is normal. No respiratory distress.  Abdominal:     General: Bowel sounds are normal.     Palpations: Abdomen is soft.     Tenderness: There is no abdominal tenderness. There is no guarding or rebound.  Genitourinary:    Comments: Circumcised penis, normal testicular lie, no significant tenderness to palpation, no overlying skin changes, normal cremasteric reflex, no hernia noted Musculoskeletal:     Cervical back: Neck supple.     Right lower leg: No edema.     Left lower leg: No edema.  Lymphadenopathy:     Cervical: No cervical adenopathy.  Skin:    General: Skin is warm and dry.  Neurological:     Mental Status: He is alert and oriented to person, place, and time.  Psychiatric:        Mood and Affect: Mood normal.    ED Results /  Procedures / Treatments   Labs (all labs ordered are listed, but only abnormal results are displayed) Labs Reviewed  URINALYSIS, ROUTINE W REFLEX MICROSCOPIC - Abnormal; Notable for the following components:      Result Value   Hgb urine dipstick SMALL (*)    All other components within normal limits  GC/CHLAMYDIA PROBE AMP ([place]) NOT AT [facility]    EKG None  Radiology US SCROTUM W/DOPPLER  Result Date: 05/04/2021 CLINICAL DATA:  Right testicular pain. EXAM: SCROTAL ULTRASOUND DOPPLER ULTRASOUND OF THE TESTICLES TECHNIQUE: Complete ultrasound examination of the testicles, epididymis, and other scrotal structures was performed. Color and spectral Doppler ultrasound were also utilized to evaluate blood flow to the testicles. COMPARISON:  None. FINDINGS: Right testicle Measurements: 4.8 cm x 2.2 cm x 2.8 cm. No mass or microlithiasis visualized. Left testicle Measurements: 4.5 cm x 2.2 cm x 2.7 cm. No mass or microlithiasis visualized. Right epididymis:  Normal in size and appearance. Left epididymis:  Normal in size and appearance. Hydrocele:  Small bilateral hydroceles are seen. Varicocele:  None visualized. Pulsed Doppler interrogation of both testes demonstrates normal low resistance arterial and venous waveforms bilaterally. IMPRESSION: 1. Small bilateral hydroceles. 2. Normal bilateral testicular flow. Electronically Signed   By: Aram Candela M.D.   On: 05/04/2021 03:29    Procedures Procedures   Medications Ordered in ED Medications  ketorolac (TORADOL) 30 MG/ML injection 30 mg (30 mg Intramuscular Given 05/04/21 0321)    ED Course  I have reviewed the triage vital signs and the nursing notes.  Pertinent labs & imaging results that were available during my care of the patient were reviewed by me and considered in my medical decision making (see chart for details).    MDM Rules/Calculators/A&P                           Patient presents with testicle pain.  Over 24  hours.  He is overall nontoxic and vital signs are reassuring.  Pain is atraumatic.  Testicle exam is fairly unremarkable without significant tenderness, hernia, evidence of torsion.  Patient denies any urinary symptoms or discharge.  Considerations include but not limited to epididymitis, torsion, mass.  Urinalysis without evidence of obvious infection.  Ultrasound just shows bilateral hydroceles.  Overall work-up is reassuring.  We will treat symptomatically as if he has epididymitis.  Recommend well fitting underwear and anti-inflammatories.  Urology follow-up provided.  After history, exam, and medical workup I feel the patient has been appropriately medically screened and is safe for discharge home. Pertinent diagnoses were  discussed with the patient. Patient was given return precautions.  Final Clinical Impression(s) / ED Diagnoses Final diagnoses:  Testicle pain  Pain in right testicle  Other hydrocele    Rx / DC Orders ED Discharge Orders          Ordered    ibuprofen (ADVIL) 600 MG tablet  Every 6 hours PRN        05/04/21 0407             Shon Baton, MD 05/04/21 [phone]

## 2021-05-04 NOTE — Discharge Instructions (Addendum)
You were seen today for testicle pain.  Your ultrasound does not show any evidence of torsion or infection.  Take ibuprofen as needed.  Make sure that you are wearing well fitted supportive underwear.  You do have a hydrocele.  Often people are asymptomatic from this.  Follow-up with urology as needed.

## 2021-05-04 NOTE — ED Notes (Signed)
In with patient now doing ultrasound

## 2021-05-04 NOTE — ED Triage Notes (Signed)
Pt came from home via POV. C/c: right testical pain that radiates up into RLQ x1 day. Pt denies N/V/D. Pt denies urinary sx or hematuria. Pt states he has never experienced this before.

## 2021-07-02 ENCOUNTER — Other Ambulatory Visit: Payer: Self-pay

## 2021-07-02 ENCOUNTER — Emergency Department (HOSPITAL_BASED_OUTPATIENT_CLINIC_OR_DEPARTMENT_OTHER)
Admission: EM | Admit: 2021-07-02 | Discharge: 2021-07-02 | Disposition: A | Discharge status: Home / Self Care | Payer: No Typology Code available for payment source | Attending: Emergency Medicine | Admitting: Emergency Medicine

## 2021-07-02 ENCOUNTER — Encounter (HOSPITAL_BASED_OUTPATIENT_CLINIC_OR_DEPARTMENT_OTHER): Payer: Self-pay | Admitting: Emergency Medicine

## 2021-07-02 DIAGNOSIS — Z5321 Procedure and treatment not carried out due to patient leaving prior to being seen by health care provider: Secondary | ICD-10-CM | POA: Diagnosis not present

## 2021-07-02 DIAGNOSIS — Z202 Contact with and (suspected) exposure to infections with a predominantly sexual mode of transmission: Secondary | ICD-10-CM | POA: Insufficient documentation

## 2021-07-02 LAB — URINALYSIS, ROUTINE W REFLEX MICROSCOPIC
Bilirubin Urine: NEGATIVE
Glucose, UA: NEGATIVE mg/dL
Hgb urine dipstick: NEGATIVE
Ketones, ur: NEGATIVE mg/dL
Leukocytes,Ua: NEGATIVE
Nitrite: NEGATIVE
Specific Gravity, Urine: 1.017 (ref 1.005–1.030)
pH: 5.5 (ref 5.0–8.0)

## 2021-07-02 NOTE — ED Notes (Signed)
Patient called x1, No answer.

## 2021-07-02 NOTE — ED Triage Notes (Signed)
Pt arrives pov, ambulatory to triage, reports exposure to chlamydia. Pt denies symptoms at this time.

## 2021-07-02 NOTE — ED Notes (Signed)
Patient called several times with no Response.

## 2021-07-04 LAB — GC/CHLAMYDIA PROBE AMP (~~LOC~~) NOT AT ARMC
Chlamydia: NEGATIVE
Comment: NEGATIVE
Comment: NORMAL
Neisseria Gonorrhea: NEGATIVE

## 2022-08-11 ENCOUNTER — Ambulatory Visit: Payer: No Typology Code available for payment source

## 2022-11-06 ENCOUNTER — Ambulatory Visit: Payer: Self-pay | Admitting: Family Medicine

## 2023-01-27 ENCOUNTER — Encounter: Payer: Self-pay | Admitting: Emergency Medicine

## 2023-01-27 ENCOUNTER — Ambulatory Visit
Admission: EM | Admit: 2023-01-27 | Discharge: 2023-01-27 | Disposition: A | Discharge status: Home / Self Care | Payer: Medicaid Other | Attending: Internal Medicine | Admitting: Internal Medicine

## 2023-01-27 ENCOUNTER — Ambulatory Visit: Payer: No Typology Code available for payment source

## 2023-01-27 ENCOUNTER — Ambulatory Visit (INDEPENDENT_AMBULATORY_CARE_PROVIDER_SITE_OTHER): Payer: Medicaid Other

## 2023-01-27 ENCOUNTER — Other Ambulatory Visit: Payer: Self-pay

## 2023-01-27 DIAGNOSIS — M79672 Pain in left foot: Secondary | ICD-10-CM

## 2023-01-27 DIAGNOSIS — M25572 Pain in left ankle and joints of left foot: Secondary | ICD-10-CM

## 2023-01-27 NOTE — ED Triage Notes (Signed)
Pt sts left foot pain after twisting while walking

## 2023-01-27 NOTE — Discharge Instructions (Signed)
Your x-ray was normal.  Ace wrap has been applied.  Do not sleep in this.  Weightbearing as tolerated.  Apply ice and elevate extremity.  Follow-up with orthopedist if symptoms persist or worsen.

## 2023-01-27 NOTE — ED Provider Notes (Signed)
[name]    CSN: [id number] Arrival date & time: 01/27/23  1902      History   Chief Complaint Chief Complaint  Patient presents with   Foot Pain    HPI Andrew Leon is a 27 y.o. male.   Patient presents today with left ankle and foot pain after an injury that occurred about 2 PM today.  Patient reports that he was walking down a hill at school when his ankle twisted over.  He did not fall down.  Patient has not taken any medication for pain.  Reports pain with bearing weight.  Patient not reporting any numbness or tingling.   Foot Pain    History reviewed. No pertinent past medical history.  There are no problems to display for this patient.   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 Authorizing Provider  ibuprofen (ADVIL) 600 MG tablet Take 1 tablet (600 mg total) by mouth every 6 (six) hours as needed. 05/04/21   Horton, Mayer Masker, MD    Family History History reviewed. No pertinent family history.  Social History Social History   Tobacco Use   Smoking status: Every Day    Packs/day: 1.00    Years: 8.00    Additional pack years: 0.00    Total pack years: 8.00    Types: Cigarettes   Smokeless tobacco: Never  Vaping Use   Vaping Use: Never used  Substance Use Topics   Alcohol use: Yes    Comment: occ x week   Drug use: Yes    Types: Marijuana    Comment: percocet      Allergies   Patient has no known allergies.   Review of Systems Review of Systems Per HPI  Physical Exam Triage Vital Signs ED Triage Vitals [01/27/23 1938]  Enc Vitals Group     BP 109/67     Pulse Rate 96     Resp 18     Temp 98.8 F (37.1 C)     Temp Source Oral     SpO2 97 %     Weight      Height      Head Circumference      Peak Flow      Pain Score 10     Pain Loc      Pain Edu?      Excl. in GC?    No data found.  Updated Vital Signs BP 109/67 (BP Location: Left Arm)   Pulse 96   Temp 98.8 F (37.1 C) (Oral)   Resp 18   SpO2 97%   Visual Acuity Right Eye Distance:   Left Eye Distance:   Bilateral Distance:    Right Eye Near:   Left Eye Near:    Bilateral Near:     Physical Exam Constitutional:      General: He is not in acute distress.    Appearance: Normal appearance. He is  not toxic-appearing or diaphoretic.  HENT:     Head: Normocephalic and atraumatic.  Eyes:     Extraocular Movements: Extraocular movements intact.     Conjunctiva/sclera: Conjunctivae normal.  Pulmonary:     Effort: Pulmonary effort is normal.  Musculoskeletal:     Comments: Patient has tenderness to palpation to dorsal surface of left foot directly adjacent to the left lateral malleolus.  Also has mild swelling noted.  No discoloration.  No tenderness throughout the ankle, remainder of foot, toes.  Patient can wiggle toes.  Appears neurovascularly intact with no abrasions or lacerations.  Neurological:     General: No focal deficit present.     Mental Status: He is alert and oriented to person, place, and time. Mental status is at baseline.  Psychiatric:        Mood and Affect: Mood normal.        Behavior: Behavior normal.        Thought Content: Thought content normal.        Judgment: Judgment normal.      UC Treatments / Results  Labs (all labs ordered are listed, but only abnormal results are displayed) Labs Reviewed - No data to  display  EKG   Radiology DG Ankle Complete Left  Result Date: 01/27/2023 CLINICAL DATA:  Injury. EXAM: LEFT ANKLE COMPLETE - 3+ VIEW; LEFT FOOT - COMPLETE 3+ VIEW COMPARISON:  None Available. FINDINGS: Ankle: No fracture or dislocation. The ankle mortise is preserved. No focal bone abnormality. Intact talar dome. Suggestion of mild soft tissue edema. Foot: No fracture or dislocation. The alignment and joint spaces are preserved. No focal bone abnormality. There may be mild soft tissue edema. IMPRESSION: No fracture or dislocation of the left ankle or foot. Suggestion of mild soft tissue edema. Electronically Signed   By: Narda Rutherford M.D.   On: 01/27/2023 19:27   DG Foot Complete Left  Result Date: 01/27/2023 CLINICAL DATA:  Injury. EXAM: LEFT ANKLE COMPLETE - 3+ VIEW; LEFT FOOT - COMPLETE 3+ VIEW COMPARISON:  None Available. FINDINGS: Ankle: No fracture or dislocation. The ankle mortise is preserved. No focal bone abnormality. Intact talar dome. Suggestion of mild soft tissue edema. Foot: No fracture or dislocation. The alignment and joint spaces are preserved. No focal bone abnormality. There may be mild soft tissue edema. IMPRESSION: No fracture or dislocation of the left ankle or foot. Suggestion of mild soft tissue edema. Electronically Signed   By: Narda Rutherford M.D.   On: 01/27/2023 19:27    Procedures Procedures (including critical care time)  Medications Ordered in UC Medications - No data to display  Initial Impression / Assessment and Plan / UC Course  I have reviewed the triage vital signs and the nursing notes.  Pertinent labs & imaging results that were available during my care of the patient were reviewed by me and considered in my medical decision making (see chart for details).     X-rays were negative for any acute bony abnormality.  Suspect muscular strain/injury.  Advised RICE.  Ace wrap applied in urgent care and patient was  supplied with crutches for  weightbearing as tolerated.  Patient advised not to sleep in Ace wrap.  Advised safe over-the-counter pain relievers as well.  Advised following up with orthopedist at provided contact information if symptoms persist or worsen.  Patient verbalized understanding and was agreeable with plan. Final Clinical Impressions(s) / UC Diagnoses   Final diagnoses:  Acute left ankle pain  Left foot pain  Discharge Instructions      Your x-ray was normal.  Ace wrap has been applied.  Do not sleep in this.  Weightbearing as tolerated.  Apply ice and elevate extremity.  Follow-up with orthopedist if symptoms persist or worsen.    ED Prescriptions   None    PDMP not reviewed this encounter.   Gustavus Bryant, [place] 01/27/23 2147

## 2023-05-15 IMAGING — US US SCROTUM W/ DOPPLER COMPLETE
1 series · 15 of 25 positions shown · non-contrast
Comparison: None.

CLINICAL DATA: Right testicular pain.

EXAM:
SCROTAL ULTRASOUND
DOPPLER ULTRASOUND OF THE TESTICLES
TECHNIQUE: Complete ultrasound examination of the testicles, epididymis, and
other scrotal structures was performed. Color and spectral Doppler
ultrasound were also utilized to evaluate blood flow to the
testicles.

[Series 1: us art/ven flow abd pelv doppl mc & wl · 15 of 52 slices shown]
[im 1/52]
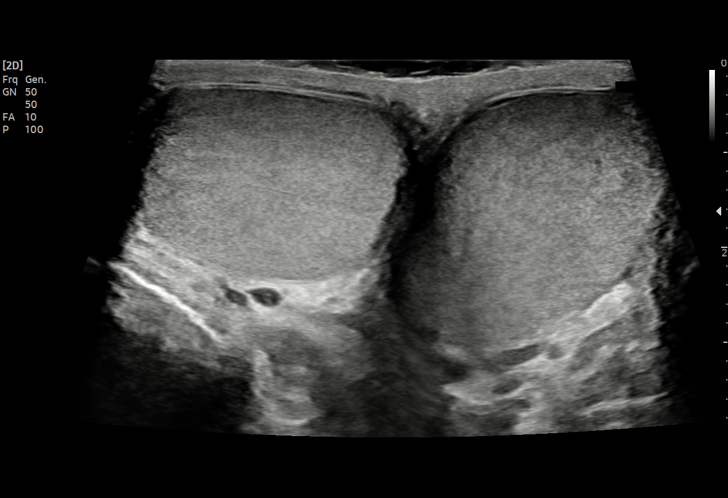
[im 5/52]
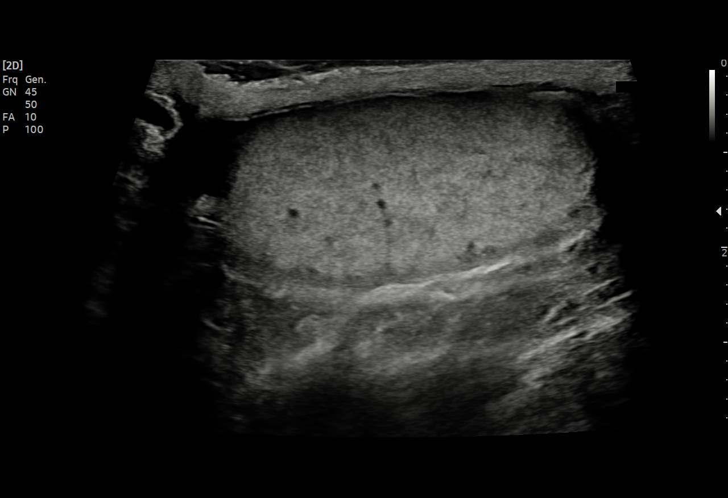
[im 9/52]
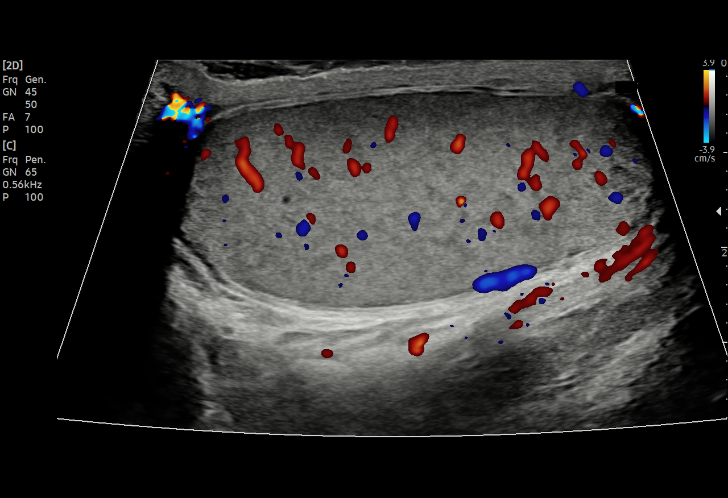
[im 11/52]
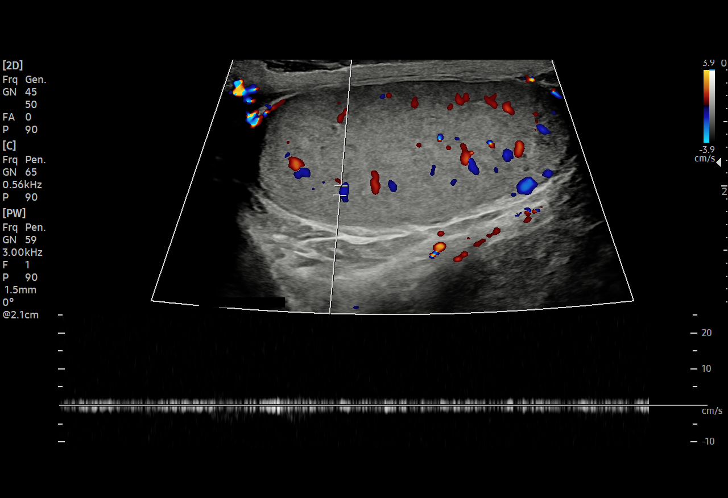
[im 15/52]
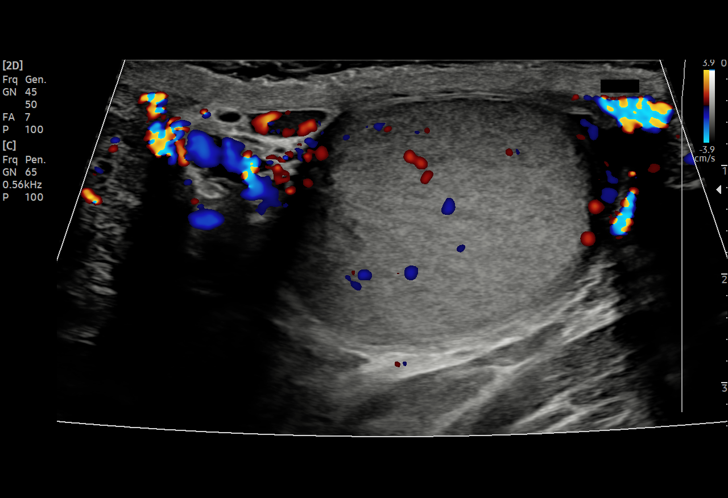
[im 20/52]
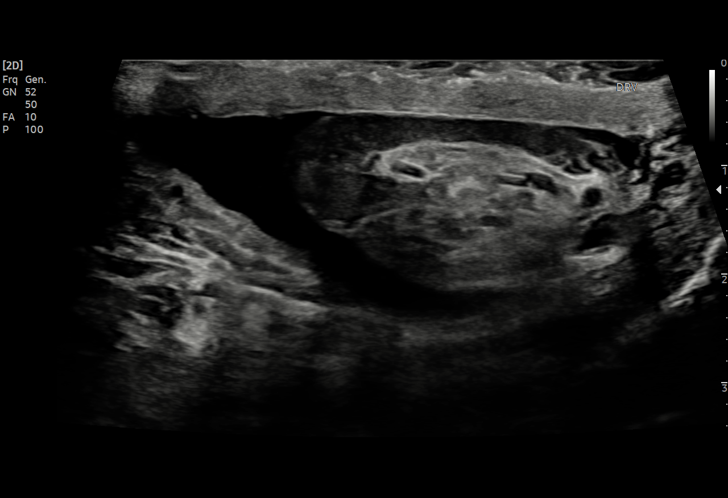
[im 22/52]
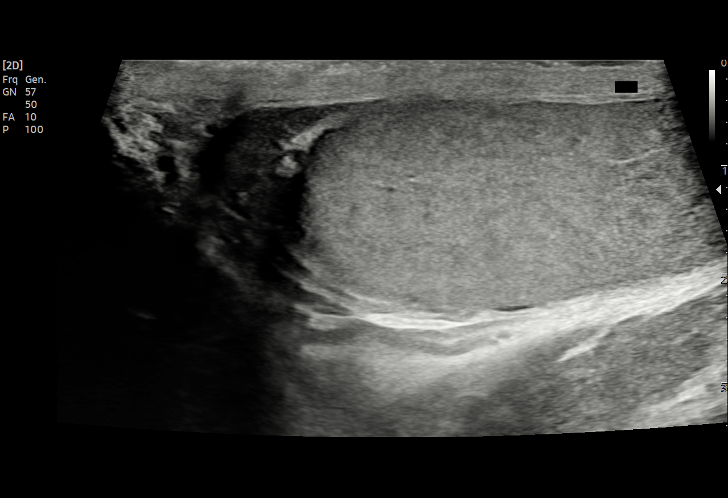
[im 26/52]
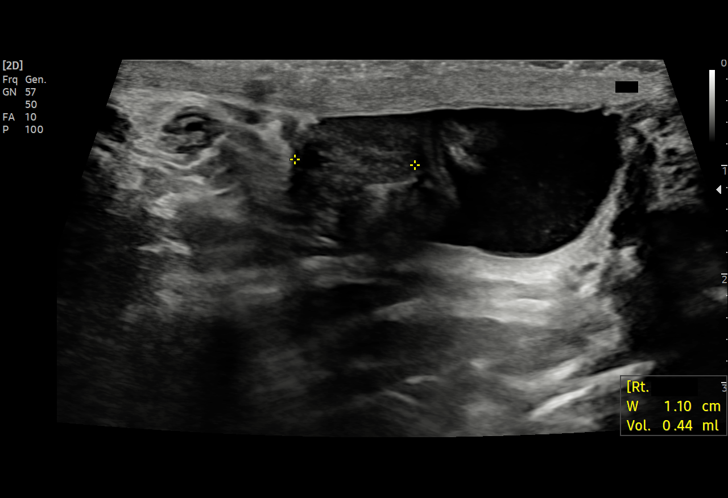
[im 30/52]
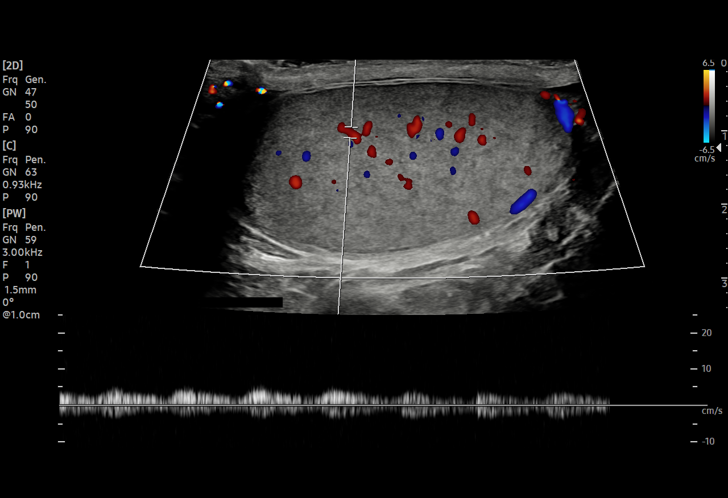
[im 32/52]
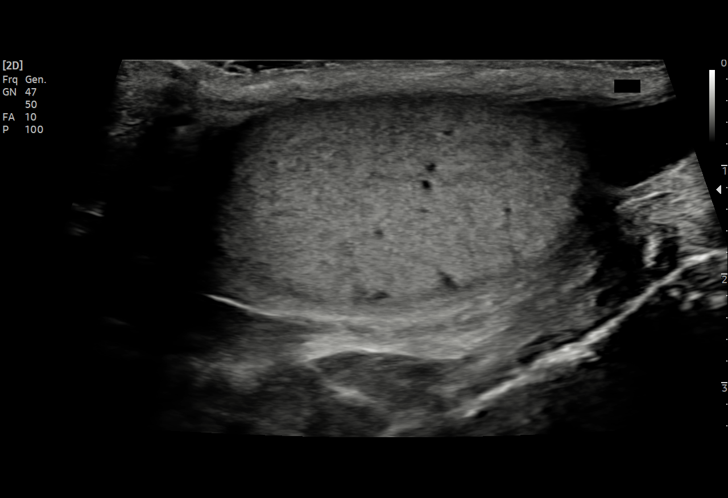
[im 37/52]
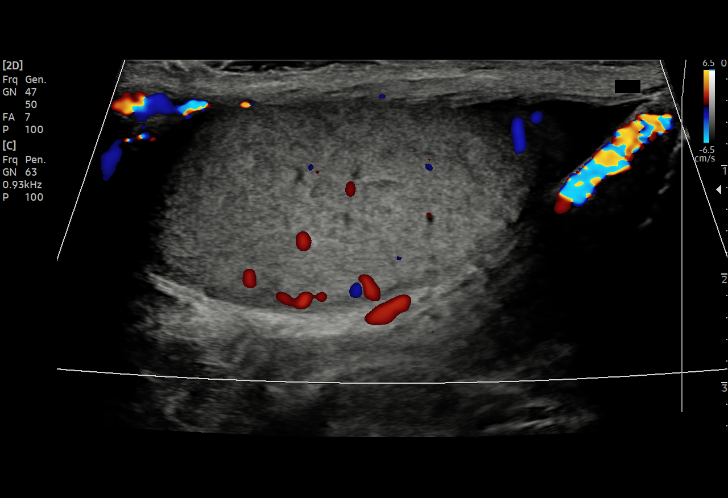
[im 41/52]
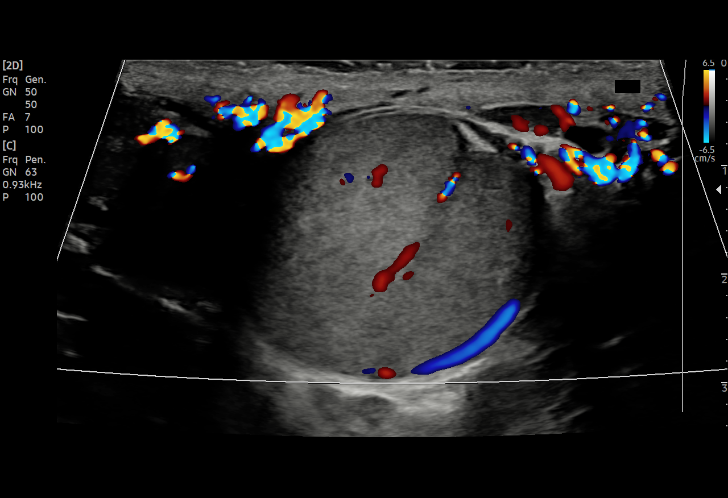
[im 43/52]
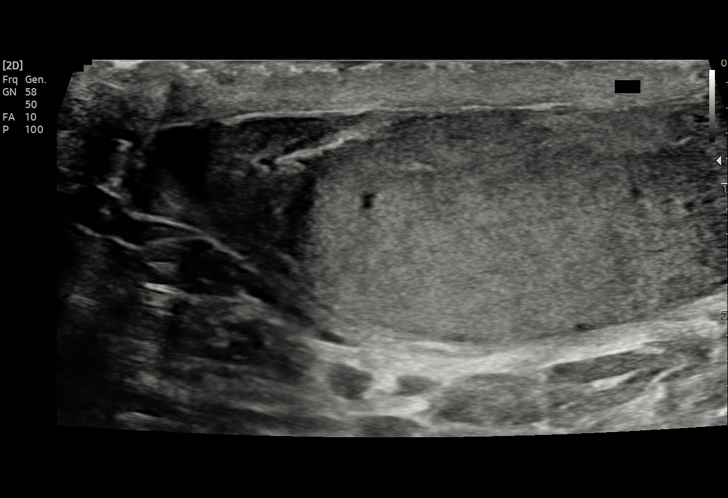
[im 47/52]
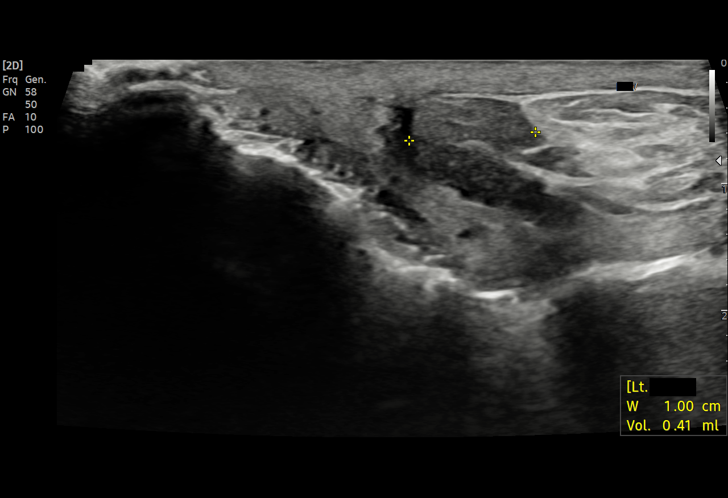
[im 52/52]
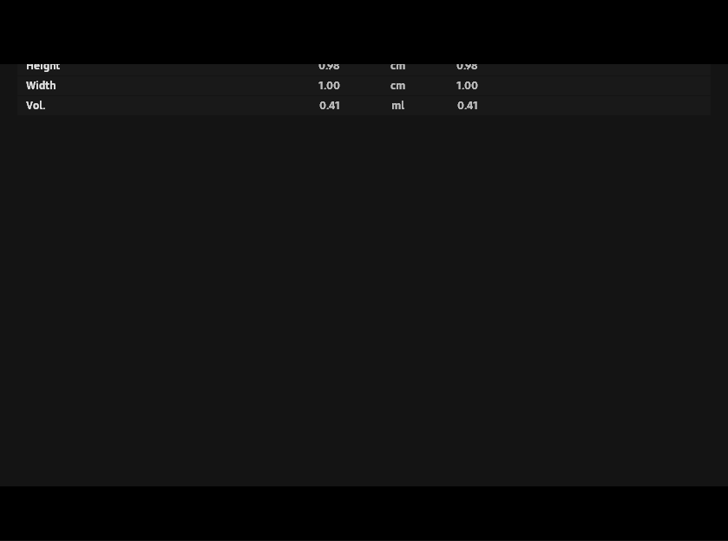

[15 of 25 positions shown; findings below may reference images not displayed]

FINDINGS: Right testicle

Measurements: 4.8 cm x 2.2 cm x 2.8 cm. No mass or microlithiasis
visualized.

Left testicle

Measurements: 4.5 cm x 2.2 cm x 2.7 cm. No mass or microlithiasis
visualized.

Right epididymis:  Normal in size and appearance.

Left epididymis:  Normal in size and appearance.

Hydrocele:  Small bilateral hydroceles are seen.

Varicocele:  None visualized.

Pulsed Doppler interrogation of both testes demonstrates normal low
resistance arterial and venous waveforms bilaterally.
IMPRESSION: 1. Small bilateral hydroceles.
2. Normal bilateral testicular flow.

## 2023-09-26 ENCOUNTER — Emergency Department (HOSPITAL_BASED_OUTPATIENT_CLINIC_OR_DEPARTMENT_OTHER): Payer: Medicaid Other | Admitting: Radiology

## 2023-09-26 ENCOUNTER — Encounter (HOSPITAL_BASED_OUTPATIENT_CLINIC_OR_DEPARTMENT_OTHER): Payer: Self-pay

## 2023-09-26 ENCOUNTER — Emergency Department (HOSPITAL_BASED_OUTPATIENT_CLINIC_OR_DEPARTMENT_OTHER)
Admission: EM | Admit: 2023-09-26 | Discharge: 2023-09-26 | Disposition: A | Discharge status: Home / Self Care | Payer: Medicaid Other

## 2023-09-26 DIAGNOSIS — J45909 Unspecified asthma, uncomplicated: Secondary | ICD-10-CM | POA: Insufficient documentation

## 2023-09-26 DIAGNOSIS — R6889 Other general symptoms and signs: Secondary | ICD-10-CM | POA: Diagnosis not present

## 2023-09-26 DIAGNOSIS — R079 Chest pain, unspecified: Secondary | ICD-10-CM | POA: Diagnosis present

## 2023-09-26 DIAGNOSIS — R0789 Other chest pain: Secondary | ICD-10-CM | POA: Diagnosis not present

## 2023-09-26 LAB — BASIC METABOLIC PANEL
Anion gap: 10 (ref 5–15)
BUN: 6 mg/dL (ref 6–20)
CO2: 25 mmol/L (ref 22–32)
Calcium: 9.4 mg/dL (ref 8.9–10.3)
Chloride: 102 mmol/L (ref 98–111)
Creatinine, Ser: 0.79 mg/dL (ref 0.61–1.24)
GFR, Estimated: >60 mL/min (ref >60)
Glucose, Bld: 142 mg/dL — ABNORMAL HIGH (ref 70–99)
Potassium: 3.6 mmol/L (ref 3.5–5.1)
Sodium: 137 mmol/L (ref 135–145)

## 2023-09-26 LAB — TROPONIN I (HIGH SENSITIVITY)
Troponin I (High Sensitivity): 2 ng/L (ref <18)
Troponin I (High Sensitivity): <2 ng/L (ref <18)

## 2023-09-26 LAB — CBC
HCT: 40.9 % (ref 39.0–52.0)
Hemoglobin: 13.4 g/dL (ref 13.0–17.0)
MCH: 27.1 pg (ref 26.0–34.0)
MCHC: 32.8 g/dL (ref 30.0–36.0)
MCV: 82.8 fL (ref 80.0–100.0)
Platelets: 312 10*3/uL (ref 150–400)
RBC: 4.94 MIL/uL (ref 4.22–5.81)
RDW: 12.9 % (ref 11.5–15.5)
WBC: 10.7 10*3/uL — ABNORMAL HIGH (ref 4.0–10.5)
nRBC: 0 % (ref 0.0–0.2)

## 2023-09-26 LAB — RESP PANEL BY RT-PCR (RSV, FLU A&B, COVID)  RVPGX2
Influenza A by PCR: NEGATIVE
Influenza B by PCR: NEGATIVE
Resp Syncytial Virus by PCR: NEGATIVE
SARS Coronavirus 2 by RT PCR: NEGATIVE

## 2023-09-26 NOTE — ED Notes (Signed)
 Dc instructions reviewed with patient. Patient voiced understanding. Dc with belongings.

## 2023-09-26 NOTE — Discharge Instructions (Addendum)
 Your workup today is reassuring. It is very unlikely that your pain is due to an issue in your heart.  Your cardiac enzyme (troponin) was normal today. Your EKG which is a measure of the heart's electrical activity and rhythm is normal today. These would both show abnormalities if you were having a heart attack.  Your chest x-ray is normal today.  Your blood counts, kidney function, and electrolytes normal today.  Your COVID, flu, and RSV test are negative today.  Please follow-up with your PCP within the next week for recheck of your symptoms.  You did have an elevated glucose (blood sugar) here today which should be rechecked with your PCP.  Return to the ER if you have any shortness of breath, difficulty breathing, worsening chest pain, dizziness, jaw pain, left arm or shoulder pain, abdominal pain, unexplained fever, any other new or concerning symptoms.

## 2023-09-26 NOTE — ED Triage Notes (Signed)
 States present because "not feeling himself"  Generalized weakness.  Sharp pain mid chest sharpe that comes and goes over the last two weeks.  Cough at times non productive.

## 2023-09-26 NOTE — ED Notes (Signed)
 The paramedic's report included no reference at all to any chest pain or discomfort.

## 2023-09-26 NOTE — ED Provider Notes (Cosign Needed)
 [place] EMERGENCY DEPARTMENT AT [facility] Provider Note   CSN: [id number] Arrival date & time: 09/26/23  8295     History  Chief Complaint  Patient presents with   Chest Pain    Andrew Leon is a 28 y.o. male reports a history of asthma, presenting with concern for generally not feeling well over the past couple of days.  He reports a mild dry cough.  He reports a intermittent pain that occurs in the right side of his chest has been coming and going for the past 2 weeks.  This will come on at rest.  Does not worsen with exertion.  Denies any shortness of breath, or radiation of pain to his back.  Denies any abdominal pain, nausea or vomiting.  Denies any recent periods of immobilization such as long plane or car rides, no recent surgeries or hospitalizations.  No hormonal medication use.   Chest Pain      Home Medications Prior to Admission medications   Medication Sig Start Date End Date Taking? Authorizing Provider  ibuprofen (ADVIL) 600 MG tablet Take 1 tablet (600 mg total) by mouth every 6 (six) hours as needed. 05/04/21   Horton, Mayer Masker, MD      Allergies    Patient has no known allergies.    Review of Systems   Review of Systems  Cardiovascular:  Positive for chest pain.    Physical Exam Updated Vital Signs BP 101/68   Pulse 65   Temp 98.6 F (37 C)   Resp 13   Ht 5\' 8"  (1.727 m)   Wt 111.1 kg   SpO2 98%   BMI 37.25 kg/m  Physical Exam Vitals and nursing note reviewed.  Constitutional:      General: He is not in acute distress.    Appearance: He is well-developed.     Comments: Very well-appearing, no acute distress  HENT:     Head: Normocephalic and atraumatic.  Eyes:     Conjunctiva/sclera: Conjunctivae normal.  Cardiovascular:     Rate and Rhythm: Normal rate and regular rhythm.     Heart sounds: No murmur heard.    Comments: Radial pulses 2+ bilaterally Pulmonary:     Effort: Pulmonary effort is normal. No respiratory  distress.     Breath sounds: Normal breath sounds.  Abdominal:     Palpations: Abdomen is soft.     Tenderness: There is no abdominal tenderness.  Musculoskeletal:        General: No swelling.     Cervical back: Neck supple.  Skin:    General: Skin is warm and dry.     Capillary Refill: Capillary refill takes less than 2 seconds.  Neurological:     Mental Status: He is alert.  Psychiatric:        Mood and Affect: Mood normal.     ED Results / Procedures / Treatments   Labs (all labs ordered are listed, but only abnormal results are displayed) Labs Reviewed  BASIC METABOLIC PANEL - Abnormal; Notable for the following components:      Result Value   Glucose, Bld 142 (*)    All other components within normal limits  CBC - Abnormal; Notable for the following components:   WBC 10.7 (*)    All other components within normal limits  RESP PANEL BY RT-PCR (RSV, FLU A&B, COVID)  [id number]  TROPONIN I (HIGH SENSITIVITY)  TROPONIN I (HIGH SENSITIVITY)    EKG EKG Interpretation Date/Time:  Saturday  September 26 2023 10:01:42 EST Ventricular Rate:  77 PR Interval:  158 QRS Duration:  86 QT Interval:  370 QTC Calculation: 418 R Axis:   97  Text Interpretation: Normal sinus rhythm Rightward axis Borderline ECG When compared with ECG of 19-Mar-2021 02:02, No significant change was found Confirmed by Estanislado Pandy [phone]) on 09/26/2023 10:10:36 AM  Radiology DG Chest 2 View Result Date: 09/26/2023 CLINICAL DATA:  Chest pain. EXAM: CHEST - 2 VIEW COMPARISON:  March 19, 2021. FINDINGS: The heart size and mediastinal contours are within normal limits. Both lungs are clear. The visualized skeletal structures are unremarkable. IMPRESSION: No active cardiopulmonary disease. Electronically Signed   By: Lupita Raider M.D.   On: 09/26/2023 10:34    Procedures Procedures    Medications Ordered in ED Medications - No data to display  ED Course/ Medical Decision Making/ A&P              HEART Score: 0                    Medical Decision Making Amount and/or Complexity of Data Reviewed Labs: ordered. Radiology: ordered.     Differential diagnosis includes but is not limited to COVID, flu, RSV, viral URI, strep pharyngitis, viral pharyngitis, allergic rhinitis, pneumonia, bronchitis, ACS, arrhythmia, aortic aneurysm, pericarditis, myocarditis, pericardial effusion, cardiac tamponade, musculoskeletal pain, GERD, Boerhaave's syndrome, DVT/PE, pneumonia, pleural effusion   ED Course:  Patient very well-appearing, stable vital signs.  Moves around comfortably in the stretcher.  Physical exam unremarkable.  Cardiac auscultation with regular rate and rhythm, no murmurs appreciated.  Lungs clear to auscultation bilaterally. I Ordered, and personally interpreted labs.  The pertinent results include:   CBC with slight leukocytosis at 10.7, no anemia or other abnormalities BMP with elevated glucose at 142, no electrolyte abnormalities.  Creatinine within normal limits Initial and repeat troponin under 2 Flu, COVID, RSV negative Low concern for ACS at this time given troponin remains stable with initial troponin of under 2 and repeat of under 2, pain non-exertional, HEART score of 0. EKG with normal sinus rhythm, no ST changes. Chest x-ray without any acute abnormality. No concern for DVT or PE at this time given PERC negative. Low concern for cardiac etiology at this time Patient's flu, COVID, and RSV are negative.  Chest x-ray without any signs of pneumonia.  Possible he has a viral URI, but denies any other symptoms besides mild cough.  Unclear etiology to patient's intermittent chest pain and general feeling of unwellness, but no concern for emergent pathology at this time.  Patient stable and appropriate for discharge home this time.  Impression: Atypical chest pain   Disposition:  The patient was discharged home with instructions to follow up with PCP within the next week  for symptom recheck and re-check of blood glucose.  Return precautions given.  Imaging Studies ordered: I ordered imaging studies including chest x-ray I independently visualized the imaging with scope of interpretation limited to determining acute life threatening conditions related to emergency care. Imaging showed no acute abnormalities I agree with the radiologist interpretation   Cardiac Monitoring: / EKG: The patient was maintained on a cardiac monitor.  I personally viewed and interpreted the cardiac monitored which showed an underlying rhythm of: Normal sinus rhythm                Final Clinical Impression(s) / ED Diagnoses Final diagnoses:  Feeling unwell  Atypical chest pain    Rx /  DC Orders ED Discharge Orders     None         Arabella Merles, [place] 09/26/23 1347

## 2024-03-29 ENCOUNTER — Ambulatory Visit
Admission: EM | Admit: 2024-03-29 | Discharge: 2024-03-29 | Disposition: A | Discharge status: Home / Self Care | Attending: Family Medicine | Admitting: Family Medicine

## 2024-03-29 DIAGNOSIS — Z113 Encounter for screening for infections with a predominantly sexual mode of transmission: Secondary | ICD-10-CM | POA: Insufficient documentation

## 2024-03-29 DIAGNOSIS — L739 Follicular disorder, unspecified: Secondary | ICD-10-CM | POA: Insufficient documentation

## 2024-03-29 MED ORDER — DOXYCYCLINE HYCLATE 100 MG PO CAPS: 100.0000 mg | ORAL_CAPSULE | Freq: Two times a day (BID) | ORAL | 0 refills | Status: AC

## 2024-03-29 NOTE — ED Triage Notes (Signed)
 Pt requesting STD testing-states he has a bump on his left testicle x today-NAD-steady gait

## 2024-03-29 NOTE — ED Provider Notes (Signed)
 [facility]  Note:  This document was prepared using Conservation officer, historic buildings and may include unintentional dictation errors.  MRN: [id number] DOB: Mar 21, 1996  Subjective:   Andrew Leon is a 28 y.o. male presenting for nontender bump over the left testicle.  Reports that he initially squeezed it and had some stringy white material come out.  Since then he has continued to squeeze but nothing comes out.  Denies dysuria, hematuria, urinary frequency, penile discharge, penile swelling, testicular pain, testicular swelling, anal pain, groin pain.  Would like complete STI testing.  No current facility-administered medications for this encounter.  Current Outpatient Medications:    ibuprofen  (ADVIL ) 600 MG tablet, Take 1 tablet (600 mg total) by mouth every 6 (six) hours as needed., Disp: 30 tablet, Rfl: 0   No Known Allergies  No past medical history on file.   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  Social History   Tobacco Use   Smoking status: Former    Current packs/day: 1.00    Average packs/day: 1 pack/day for 8.0 years (8.0 ttl pk-yrs)    Types: Cigarettes   Smokeless tobacco: Never  Vaping Use   Vaping status: Never Used  Substance Use Topics   Alcohol use: Yes    Comment: occ x week   Drug use: Yes    Types: Marijuana    Comment: percocet    ROS   Objective:   Vitals: There were no vitals taken for this visit.  Physical  Exam Constitutional:      General: He is not in acute distress.    Appearance: Normal appearance. He is well-developed and normal weight. He is not ill-appearing, toxic-appearing or diaphoretic.  HENT:     Head: Normocephalic and atraumatic.     Right Ear: External ear normal.     Left Ear: External ear normal.     Nose: Nose normal.     Mouth/Throat:     Pharynx: Oropharynx is clear.  Eyes:     General: No scleral icterus.       Right eye: No discharge.        Left eye: No discharge.     Extraocular Movements: Extraocular movements intact.  Cardiovascular:     Rate and Rhythm: Normal rate.  Pulmonary:     Effort: Pulmonary effort is normal.  Genitourinary:    Penis: Circumcised. No phimosis, paraphimosis, hypospadias, erythema, tenderness, discharge, swelling or lesions.      Testes:        Right: Mass, tenderness, swelling, testicular hydrocele or varicocele not present. Right testis is descended. Cremasteric reflex is present.         Left: Mass, tenderness, swelling, testicular hydrocele or varicocele not present. Left testis is descended. Cremasteric reflex is present.      Epididymis:     Right: Not inflamed or enlarged. No mass or tenderness.     Left: Not inflamed or enlarged. No mass or tenderness.    Musculoskeletal:     Cervical  back: Normal range of motion.  Neurological:     Mental Status: He is alert and oriented to person, place, and time.  Psychiatric:        Mood and Affect: Mood normal.        Behavior: Behavior normal.        Thought Content: Thought content normal.        Judgment: Judgment normal.     Assessment and Plan :   PDMP not reviewed this encounter.  1. Folliculitis   2. Screen for STD (sexually transmitted disease)    Suspect folliculitis and recommended doxycycline , warm compresses.  Use ibuprofen  or naproxen for inflammation.  Complete STI check pending.  Counseled patient on potential for adverse effects with medications  prescribed/recommended today, ER and return-to-clinic precautions discussed, patient verbalized understanding.    Christopher Savannah, [place] 03/29/24 [phone]

## 2024-03-30 LAB — HSV 1/2 PCR (SURFACE)
HSV-1 DNA: NOT DETECTED
HSV-2 DNA: NOT DETECTED

## 2024-03-30 LAB — CYTOLOGY, (ORAL, ANAL, URETHRAL) ANCILLARY ONLY
Chlamydia: NEGATIVE
Comment: NEGATIVE
Comment: NEGATIVE
Comment: NORMAL
Neisseria Gonorrhea: NEGATIVE
Trichomonas: NEGATIVE

## 2024-03-30 LAB — HIV ANTIBODY (ROUTINE TESTING W REFLEX): HIV Screen 4th Generation wRfx: NONREACTIVE

## 2024-03-30 LAB — RPR: RPR Ser Ql: NONREACTIVE
